# Patient Record
Sex: Female | Born: 1955 | Race: White | Hispanic: No | Marital: Married | Smoking: Never smoker
Health system: Southern US, Community
[De-identification: ages and names within clinical notes are randomized; demographics above are authoritative.]

## PROBLEM LIST (undated history)

## (undated) DIAGNOSIS — M069 Rheumatoid arthritis, unspecified: Secondary | ICD-10-CM

## (undated) DIAGNOSIS — H9193 Unspecified hearing loss, bilateral: Secondary | ICD-10-CM

## (undated) DIAGNOSIS — K5792 Diverticulitis of intestine, part unspecified, without perforation or abscess without bleeding: Secondary | ICD-10-CM

## (undated) DIAGNOSIS — G373 Acute transverse myelitis in demyelinating disease of central nervous system: Secondary | ICD-10-CM

## (undated) DIAGNOSIS — M1A0790 Idiopathic chronic gout, unspecified ankle and foot, without tophus (tophi): Secondary | ICD-10-CM

## (undated) DIAGNOSIS — I5030 Unspecified diastolic (congestive) heart failure: Secondary | ICD-10-CM

## (undated) DIAGNOSIS — M1990 Unspecified osteoarthritis, unspecified site: Secondary | ICD-10-CM

## (undated) DIAGNOSIS — I10 Essential (primary) hypertension: Secondary | ICD-10-CM

## (undated) DIAGNOSIS — I872 Venous insufficiency (chronic) (peripheral): Secondary | ICD-10-CM

## (undated) DIAGNOSIS — K439 Ventral hernia without obstruction or gangrene: Secondary | ICD-10-CM

## (undated) DIAGNOSIS — I890 Lymphedema, not elsewhere classified: Secondary | ICD-10-CM

## (undated) DIAGNOSIS — K8020 Calculus of gallbladder without cholecystitis without obstruction: Secondary | ICD-10-CM

## (undated) DIAGNOSIS — E669 Obesity, unspecified: Secondary | ICD-10-CM

## (undated) DIAGNOSIS — H9190 Unspecified hearing loss, unspecified ear: Secondary | ICD-10-CM

## (undated) DIAGNOSIS — I509 Heart failure, unspecified: Secondary | ICD-10-CM

## (undated) DIAGNOSIS — K449 Diaphragmatic hernia without obstruction or gangrene: Secondary | ICD-10-CM

## (undated) DIAGNOSIS — R200 Anesthesia of skin: Secondary | ICD-10-CM

## (undated) DIAGNOSIS — M71331 Other bursal cyst, right wrist: Secondary | ICD-10-CM

## (undated) DIAGNOSIS — M159 Polyosteoarthritis, unspecified: Secondary | ICD-10-CM

## (undated) DIAGNOSIS — E119 Type 2 diabetes mellitus without complications: Secondary | ICD-10-CM

## (undated) DIAGNOSIS — I272 Pulmonary hypertension, unspecified: Secondary | ICD-10-CM

## (undated) DIAGNOSIS — E785 Hyperlipidemia, unspecified: Secondary | ICD-10-CM

## (undated) DIAGNOSIS — Z79631 Long term (current) use of antimetabolite agent: Secondary | ICD-10-CM

## (undated) DIAGNOSIS — N183 Chronic kidney disease, stage 3 unspecified: Secondary | ICD-10-CM

## (undated) DIAGNOSIS — H3500 Unspecified background retinopathy: Secondary | ICD-10-CM

## (undated) DIAGNOSIS — E11319 Type 2 diabetes mellitus with unspecified diabetic retinopathy without macular edema: Secondary | ICD-10-CM

## (undated) DIAGNOSIS — N189 Chronic kidney disease, unspecified: Secondary | ICD-10-CM

## (undated) DIAGNOSIS — I4510 Unspecified right bundle-branch block: Secondary | ICD-10-CM

## (undated) HISTORY — PX: HYSTERECTOMY, VAGINAL, WITH SALPINGO-OOPHORECTOMY: SHX7589

## (undated) HISTORY — PX: CHOLECYSTECTOMY: SHX55

## (undated) HISTORY — PX: CARDIAC CATHETERIZATION: SHX172

## (undated) HISTORY — PX: ABDOMINAL HYSTERECTOMY: SHX81

---

## 2005-07-29 ENCOUNTER — Emergency Department: Payer: Self-pay | Admitting: Emergency Medicine

## 2006-05-30 ENCOUNTER — Ambulatory Visit: Payer: Self-pay | Admitting: Family Medicine

## 2007-03-07 ENCOUNTER — Emergency Department: Payer: Self-pay | Admitting: Emergency Medicine

## 2007-04-28 ENCOUNTER — Ambulatory Visit: Payer: Self-pay | Admitting: Family Medicine

## 2007-07-28 ENCOUNTER — Ambulatory Visit: Payer: Self-pay | Admitting: Family Medicine

## 2007-11-16 ENCOUNTER — Emergency Department: Payer: Self-pay | Admitting: Emergency Medicine

## 2008-01-29 ENCOUNTER — Ambulatory Visit: Payer: Self-pay | Admitting: Unknown Physician Specialty

## 2008-02-02 ENCOUNTER — Ambulatory Visit: Payer: Self-pay | Admitting: Cardiology

## 2008-02-03 ENCOUNTER — Ambulatory Visit: Payer: Self-pay | Admitting: Unknown Physician Specialty

## 2008-09-17 ENCOUNTER — Ambulatory Visit: Payer: Self-pay | Admitting: Family Medicine

## 2010-07-02 ENCOUNTER — Emergency Department: Payer: Self-pay | Admitting: Emergency Medicine

## 2010-08-01 ENCOUNTER — Ambulatory Visit: Payer: Self-pay | Admitting: Sports Medicine

## 2011-04-18 ENCOUNTER — Ambulatory Visit: Payer: Self-pay | Admitting: Family Medicine

## 2012-08-06 ENCOUNTER — Ambulatory Visit: Payer: Self-pay | Admitting: Family Medicine

## 2013-09-14 ENCOUNTER — Ambulatory Visit: Payer: Self-pay | Admitting: Family Medicine

## 2014-02-23 ENCOUNTER — Emergency Department: Payer: Self-pay | Admitting: Emergency Medicine

## 2014-08-17 ENCOUNTER — Other Ambulatory Visit: Payer: Self-pay | Admitting: Family Medicine

## 2014-08-17 DIAGNOSIS — Z1231 Encounter for screening mammogram for malignant neoplasm of breast: Secondary | ICD-10-CM

## 2014-09-16 ENCOUNTER — Ambulatory Visit: Payer: Self-pay

## 2014-10-05 ENCOUNTER — Ambulatory Visit
Admission: RE | Admit: 2014-10-05 | Discharge: 2014-10-05 | Disposition: A | Discharge status: Home / Self Care | Payer: Medicare Other | Source: Ambulatory Visit | Attending: Family Medicine | Admitting: Family Medicine

## 2014-10-05 DIAGNOSIS — Z1231 Encounter for screening mammogram for malignant neoplasm of breast: Secondary | ICD-10-CM | POA: Diagnosis not present

## 2015-11-10 ENCOUNTER — Other Ambulatory Visit: Payer: Self-pay | Admitting: Family Medicine

## 2015-11-10 DIAGNOSIS — Z1239 Encounter for other screening for malignant neoplasm of breast: Secondary | ICD-10-CM

## 2015-12-15 ENCOUNTER — Ambulatory Visit
Admission: RE | Admit: 2015-12-15 | Discharge: 2015-12-15 | Disposition: A | Discharge status: Home / Self Care | Payer: Medicare Other | Source: Ambulatory Visit | Attending: Family Medicine | Admitting: Family Medicine

## 2015-12-15 DIAGNOSIS — Z1231 Encounter for screening mammogram for malignant neoplasm of breast: Secondary | ICD-10-CM | POA: Insufficient documentation

## 2015-12-15 DIAGNOSIS — Z1239 Encounter for other screening for malignant neoplasm of breast: Secondary | ICD-10-CM

## 2016-06-11 DIAGNOSIS — Z794 Long term (current) use of insulin: Secondary | ICD-10-CM | POA: Diagnosis not present

## 2016-06-11 DIAGNOSIS — E119 Type 2 diabetes mellitus without complications: Secondary | ICD-10-CM | POA: Diagnosis not present

## 2016-06-18 DIAGNOSIS — E1165 Type 2 diabetes mellitus with hyperglycemia: Secondary | ICD-10-CM | POA: Diagnosis not present

## 2016-06-18 DIAGNOSIS — H3500 Unspecified background retinopathy: Secondary | ICD-10-CM | POA: Diagnosis not present

## 2016-06-24 ENCOUNTER — Emergency Department
Admission: EM | Admit: 2016-06-24 | Discharge: 2016-06-24 | Disposition: A | Discharge status: Home / Self Care | Payer: Medicare Other | Attending: Emergency Medicine | Admitting: Emergency Medicine

## 2016-06-24 ENCOUNTER — Emergency Department: Payer: Medicare Other

## 2016-06-24 DIAGNOSIS — E0841 Diabetes mellitus due to underlying condition with diabetic mononeuropathy: Secondary | ICD-10-CM

## 2016-06-24 DIAGNOSIS — M79672 Pain in left foot: Secondary | ICD-10-CM | POA: Insufficient documentation

## 2016-06-24 DIAGNOSIS — E1140 Type 2 diabetes mellitus with diabetic neuropathy, unspecified: Secondary | ICD-10-CM | POA: Diagnosis not present

## 2016-06-24 DIAGNOSIS — M79662 Pain in left lower leg: Secondary | ICD-10-CM | POA: Diagnosis present

## 2016-06-24 LAB — CBC WITH DIFFERENTIAL/PLATELET
Basophils Absolute: 0.1 10*3/uL (ref 0–0.1)
Basophils Relative: 1 %
Eosinophils Absolute: 0.5 10*3/uL (ref 0–0.7)
Eosinophils Relative: 5 %
HCT: 37.9 % (ref 35.0–47.0)
Hemoglobin: 12.8 g/dL (ref 12.0–16.0)
Lymphocytes Relative: 33 %
Lymphs Abs: 3.4 10*3/uL (ref 1.0–3.6)
MCH: 29.4 pg (ref 26.0–34.0)
MCHC: 33.7 g/dL (ref 32.0–36.0)
MCV: 87.3 fL (ref 80.0–100.0)
Monocytes Absolute: 0.6 10*3/uL (ref 0.2–0.9)
Monocytes Relative: 6 %
Neutro Abs: 5.8 10*3/uL (ref 1.4–6.5)
Neutrophils Relative %: 55 %
Platelets: 270 10*3/uL (ref 150–440)
RBC: 4.35 MIL/uL (ref 3.80–5.20)
RDW: 14.6 % — ABNORMAL HIGH (ref 11.5–14.5)
WBC: 10.4 10*3/uL (ref 3.6–11.0)

## 2016-06-24 LAB — URIC ACID: Uric Acid, Serum: 6.3 mg/dL (ref 2.3–6.6)

## 2016-06-24 LAB — COMPREHENSIVE METABOLIC PANEL
ALT: 28 U/L (ref 14–54)
AST: 30 U/L (ref 15–41)
Albumin: 3.9 g/dL (ref 3.5–5.0)
Alkaline Phosphatase: 58 U/L (ref 38–126)
Anion gap: 10 (ref 5–15)
BUN: 14 mg/dL (ref 6–20)
CO2: 26 mmol/L (ref 22–32)
Calcium: 9.4 mg/dL (ref 8.9–10.3)
Chloride: 100 mmol/L — ABNORMAL LOW (ref 101–111)
Creatinine, Ser: 1.03 mg/dL — ABNORMAL HIGH (ref 0.44–1.00)
GFR calc Af Amer: >60 mL/min (ref >60)
GFR calc non Af Amer: 57 mL/min — ABNORMAL LOW (ref >60)
Glucose, Bld: 321 mg/dL — ABNORMAL HIGH (ref 65–99)
Potassium: 4.2 mmol/L (ref 3.5–5.1)
Sodium: 136 mmol/L (ref 135–145)
Total Bilirubin: 0.7 mg/dL (ref 0.3–1.2)
Total Protein: 7.9 g/dL (ref 6.5–8.1)

## 2016-06-24 MED ORDER — METHYLPREDNISOLONE SODIUM SUCC 125 MG IJ SOLR
125.0000 mg | Freq: Once | INTRAMUSCULAR | Status: AC
Start: 2016-06-24 — End: 2016-06-24
  Administered 2016-06-24: 125 mg via INTRAVENOUS
  Filled 2016-06-24: qty 2

## 2016-06-24 MED ORDER — PREDNISONE 10 MG (21) PO TBPK: ORAL_TABLET | Freq: Every day | ORAL | 0 refills | Status: AC

## 2016-06-24 NOTE — ED Notes (Signed)
Reviewed d/c instructions, follow-up care, prescription with patient. Pt verbalized understanding.  

## 2016-06-24 NOTE — ED Provider Notes (Signed)
[facility] Emergency Department Provider Note  ____________________________________________  Time seen: Approximately 8:09 PM  I have reviewed the triage vital signs and the nursing notes.   HISTORY  Chief Complaint Leg Pain    HPI Alice Hughes is a 61 y.o. female with a history of diabetes presents to the emergency department with left foot and left leg pain for the past 2 days. Patient rates pain at 10 out of 10 in intensity and describes it as "like electricity". Patient denies falls or mechanisms of trauma. Patient denies back pain. Patient also states that she has felt fatigued today and "not like herself". Patient denies history of DVTs, prolonged immobility, recent surgery, recent travel, excessive red meat consumption, daily alcohol use, fever or history of septic joints. No alleviating measures have been attempted.   No past medical history on file.  There are no active problems to display for this patient.   No past surgical history on file.  Prior to Admission medications   Medication Sig Start Date End Date Taking? Authorizing Provider  predniSONE (STERAPRED UNI-PAK 21 TAB) 10 MG (21) TBPK tablet Take by mouth daily. Take 6 tablets the first day, take 5 tablets the second day, take 4 tablets the third day, take 3 tablets the fourth day, take 2 tablets the fifth day, take 1 tablet the sixth day. 06/24/16 06/30/16  Lannie Fields, PA-C    Allergies Patient has no known allergies.  Family History  Problem Relation Age of Onset  . Breast cancer Sister 79    Social History Social History  Substance Use Topics  . Smoking status: Not on file  . Smokeless tobacco: Not on file  . Alcohol use Not on file     Review of Systems  Constitutional: No fever/chills Eyes: No visual changes. No discharge ENT: No upper respiratory complaints. Cardiovascular: no chest pain. Respiratory: no cough. No SOB. Musculoskeletal: Patient has left foot and  left calf pain. Skin: Negative for rash, abrasions, lacerations, ecchymosis. Neurological: Negative for headaches, focal weakness or numbness. .  ____________________________________________   PHYSICAL EXAM:  VITAL SIGNS: ED Triage Vitals  Enc Vitals Group     BP 06/24/16 1923 (!) 154/76     Pulse Rate 06/24/16 1923 (!) 105     Resp 06/24/16 1923 18     Temp 06/24/16 1923 98.3 F (36.8 C)     Temp Source 06/24/16 1923 Oral     SpO2 06/24/16 1923 98 %     Weight 06/24/16 1924 265 lb (120.2 kg)     Height 06/24/16 1924 5\' 4"  (1.626 m)     Head Circumference --      Peak Flow --      Pain Score 06/24/16 1921 8     Pain Loc --      Pain Edu? --      Excl. in [place]? --      Constitutional: Alert and oriented. Well appearing and in no acute distress. Eyes: Conjunctivae are normal. PERRL. EOMI. Head: Atraumatic.  Cardiovascular: Normal rate, regular rhythm. Normal S1 and S2.  Good peripheral circulation. Respiratory: Normal respiratory effort without tachypnea or retractions. Lungs CTAB. Good air entry to the bases with no decreased or absent breath sounds. Gastrointestinal: Bowel sounds 4 quadrants. Soft and nontender to palpation. No guarding or rigidity. No palpable masses. No distention. No CVA tenderness. Musculoskeletal:Patient demonstrates full range of motion at the left knee and left ankle. Patient is able to move all 5 left  toes palpable dorsalis pedis pulse bilaterally and symmetrically. Patient has diffuse pain to palpation along the left foot.  Neurologic:  Normal speech and language. Patient is exceptionally tender to light touch along the L5, S1 and S2 dermatomes. Skin:  No diabetic foot ulcers visualized along the feet bilaterally. Psychiatric: Mood and affect are normal. Speech and behavior are normal. Patient exhibits appropriate insight and judgement.   ____________________________________________   LABS (all labs ordered are listed, but only abnormal results  are displayed)  Labs Reviewed  CBC WITH DIFFERENTIAL/PLATELET - Abnormal; Notable for the following:       Result Value   RDW 14.6 (*)    All other components within normal limits  COMPREHENSIVE METABOLIC PANEL - Abnormal; Notable for the following:    Chloride 100 (*)    Glucose, Bld 321 (*)    Creatinine, Ser 1.03 (*)    GFR calc non Af Amer 57 (*)    All other components within normal limits  URIC ACID   ____________________________________________  EKG   ____________________________________________  RADIOLOGY Unk Pinto, personally viewed and evaluated these images (plain radiographs) as part of my medical decision making, as well as reviewing the written report by the radiologist.    Dg Tibia/fibula Left  Result Date: 06/24/2016 CLINICAL DATA:  Nontraumatic left foot and lower leg pain since yesterday. EXAM: LEFT TIBIA AND FIBULA - 2 VIEW COMPARISON:  None. FINDINGS: There is no evidence of fracture or other focal bone lesions. Soft tissues are unremarkable. There is chondrocalcinosis at the knee joint. IMPRESSION: Left knee joint chondrocalcinosis. Bones and soft tissues are unremarkable. Electronically Signed   By: Andreas Newport M.D.   On: 06/24/2016 21:04   Dg Foot Complete Left  Result Date: 06/24/2016 CLINICAL DATA:  Nontraumatic left foot pain for 1 day. EXAM: LEFT FOOT - COMPLETE 3+ VIEW COMPARISON:  None. FINDINGS: There is no evidence of fracture or dislocation. There is no evidence of arthropathy or other focal bone abnormality. Soft tissues are unremarkable. IMPRESSION: Negative. Electronically Signed   By: Andreas Newport M.D.   On: 06/24/2016 21:05    ____________________________________________    PROCEDURES  Procedure(s) performed:    Procedures    Medications  methylPREDNISolone sodium succinate (SOLU-MEDROL) 125 mg/2 mL injection 125 mg (125 mg Intravenous Given 06/24/16 2119)      ____________________________________________   INITIAL IMPRESSION / ASSESSMENT AND PLAN / ED COURSE  Pertinent labs & imaging results that were available during my care of the patient were reviewed by me and considered in my medical decision making (see chart for details).  Review of the [place] CSRS was performed in accordance of the [place] prior to dispensing any controlled drugs.    Assessment and Plan: Diabetic Neuropathy:  Patient's diagnosis is consistent with Diabetic neuropathy. CBC, CMP and uric acid levels were reassuring. DG left foot and DG tibia/fibula revealed no acute bony abnormality. Patient will be discharged home with prescriptions for tapered prednisone. Patient is to follow up with primary care as needed or otherwise directed. Patient is given ED precautions to return to the ED for any worsening or new symptoms.  ____________________________________________  FINAL CLINICAL IMPRESSION(S) / ED DIAGNOSES  Final diagnoses:  Diabetic mononeuropathy associated with diabetes mellitus due to underlying condition [address] [facility])      NEW MEDICATIONS STARTED DURING THIS VISIT:  Discharge Medication List as of 06/24/2016  9:26 PM    START taking these medications   Details  predniSONE (STERAPRED UNI-PAK 21 TAB) 10  MG (21) TBPK tablet Take by mouth daily. Take 6 tablets the first day, take 5 tablets the second day, take 4 tablets the third day, take 3 tablets the fourth day, take 2 tablets the fifth day, take 1 tablet the sixth day., Starting Sun 06/24/2016, Until Sat 06/30/2016, Print            This chart was dictated using voice recognition software/Dragon. Despite best efforts to proofread, errors can occur which can change the meaning. Any change was purely unintentional.    Karren Cobble 06/24/16 2159    Carrie Mew, MD 06/25/16 [phone]

## 2016-06-24 NOTE — ED Triage Notes (Signed)
Pt states that she started having left foot and left leg pain uesterday, shocking pains, states that she is diabetic but has never been diagnosed with neuropathy, pt also denies hx of blood clots. Pt is communicating through the sign language interpreter. Pedal pulse palpated and marked at the left foot, swelling noted to bilat feet and legs

## 2016-06-24 NOTE — ED Notes (Signed)
Used interpreter on a stick to assess, monitor, communicate with, and discharge patient.

## 2016-07-11 DIAGNOSIS — R350 Frequency of micturition: Secondary | ICD-10-CM | POA: Diagnosis not present

## 2016-09-18 DIAGNOSIS — Z794 Long term (current) use of insulin: Secondary | ICD-10-CM | POA: Diagnosis not present

## 2016-09-18 DIAGNOSIS — E1165 Type 2 diabetes mellitus with hyperglycemia: Secondary | ICD-10-CM | POA: Diagnosis not present

## 2016-09-25 DIAGNOSIS — E1165 Type 2 diabetes mellitus with hyperglycemia: Secondary | ICD-10-CM | POA: Diagnosis not present

## 2016-09-25 DIAGNOSIS — H3500 Unspecified background retinopathy: Secondary | ICD-10-CM | POA: Diagnosis not present

## 2016-11-05 ENCOUNTER — Other Ambulatory Visit: Payer: Self-pay | Admitting: Family Medicine

## 2016-11-05 DIAGNOSIS — Z1231 Encounter for screening mammogram for malignant neoplasm of breast: Secondary | ICD-10-CM

## 2016-11-08 DIAGNOSIS — Z0000 Encounter for general adult medical examination without abnormal findings: Secondary | ICD-10-CM | POA: Diagnosis not present

## 2016-11-08 DIAGNOSIS — I10 Essential (primary) hypertension: Secondary | ICD-10-CM | POA: Diagnosis not present

## 2016-11-08 DIAGNOSIS — Z124 Encounter for screening for malignant neoplasm of cervix: Secondary | ICD-10-CM | POA: Diagnosis not present

## 2016-11-20 DIAGNOSIS — E1165 Type 2 diabetes mellitus with hyperglycemia: Secondary | ICD-10-CM | POA: Diagnosis not present

## 2016-11-20 DIAGNOSIS — Z794 Long term (current) use of insulin: Secondary | ICD-10-CM | POA: Diagnosis not present

## 2017-01-08 ENCOUNTER — Ambulatory Visit
Admission: RE | Admit: 2017-01-08 | Discharge: 2017-01-08 | Disposition: A | Discharge status: Home / Self Care | Payer: Medicare Other | Source: Ambulatory Visit | Attending: Family Medicine | Admitting: Family Medicine

## 2017-01-08 DIAGNOSIS — Z1231 Encounter for screening mammogram for malignant neoplasm of breast: Secondary | ICD-10-CM | POA: Insufficient documentation

## 2017-01-25 DIAGNOSIS — E1165 Type 2 diabetes mellitus with hyperglycemia: Secondary | ICD-10-CM | POA: Diagnosis not present

## 2017-01-25 DIAGNOSIS — Z794 Long term (current) use of insulin: Secondary | ICD-10-CM | POA: Diagnosis not present

## 2017-02-11 DIAGNOSIS — E1165 Type 2 diabetes mellitus with hyperglycemia: Secondary | ICD-10-CM | POA: Diagnosis not present

## 2017-02-11 DIAGNOSIS — Z794 Long term (current) use of insulin: Secondary | ICD-10-CM | POA: Diagnosis not present

## 2017-02-11 DIAGNOSIS — E119 Type 2 diabetes mellitus without complications: Secondary | ICD-10-CM | POA: Diagnosis not present

## 2017-02-11 DIAGNOSIS — H3500 Unspecified background retinopathy: Secondary | ICD-10-CM | POA: Diagnosis not present

## 2017-05-07 DIAGNOSIS — E1165 Type 2 diabetes mellitus with hyperglycemia: Secondary | ICD-10-CM | POA: Diagnosis not present

## 2017-05-13 DIAGNOSIS — H3500 Unspecified background retinopathy: Secondary | ICD-10-CM | POA: Diagnosis not present

## 2017-05-13 DIAGNOSIS — E1165 Type 2 diabetes mellitus with hyperglycemia: Secondary | ICD-10-CM | POA: Diagnosis not present

## 2017-08-18 ENCOUNTER — Other Ambulatory Visit: Payer: Self-pay

## 2017-08-18 ENCOUNTER — Encounter: Payer: Self-pay | Admitting: Emergency Medicine

## 2017-08-18 ENCOUNTER — Emergency Department
Admission: EM | Admit: 2017-08-18 | Discharge: 2017-08-18 | Disposition: A | Discharge status: Home / Self Care | Payer: Medicare Other | Attending: Emergency Medicine | Admitting: Emergency Medicine

## 2017-08-18 DIAGNOSIS — M542 Cervicalgia: Secondary | ICD-10-CM | POA: Diagnosis present

## 2017-08-18 DIAGNOSIS — R110 Nausea: Secondary | ICD-10-CM | POA: Diagnosis not present

## 2017-08-18 DIAGNOSIS — Z79899 Other long term (current) drug therapy: Secondary | ICD-10-CM | POA: Insufficient documentation

## 2017-08-18 DIAGNOSIS — R51 Headache: Secondary | ICD-10-CM | POA: Diagnosis not present

## 2017-08-18 DIAGNOSIS — M436 Torticollis: Secondary | ICD-10-CM | POA: Diagnosis not present

## 2017-08-18 DIAGNOSIS — Z794 Long term (current) use of insulin: Secondary | ICD-10-CM | POA: Diagnosis not present

## 2017-08-18 DIAGNOSIS — E119 Type 2 diabetes mellitus without complications: Secondary | ICD-10-CM | POA: Diagnosis not present

## 2017-08-18 HISTORY — DX: Type 2 diabetes mellitus without complications: E11.9

## 2017-08-18 MED ORDER — CYCLOBENZAPRINE HCL 5 MG PO TABS: ORAL_TABLET | ORAL | 0 refills | Status: DC

## 2017-08-18 MED ORDER — KETOROLAC TROMETHAMINE 30 MG/ML IJ SOLN
30.0000 mg | Freq: Once | INTRAMUSCULAR | Status: AC
  Administered 2017-08-18: 30 mg via INTRAMUSCULAR
  Filled 2017-08-18: qty 1

## 2017-08-18 MED ORDER — HYDROCODONE-ACETAMINOPHEN 5-325 MG PO TABS
1.0000 | ORAL_TABLET | Freq: Once | ORAL | Status: AC
  Administered 2017-08-18: 1 via ORAL
  Filled 2017-08-18: qty 1

## 2017-08-18 MED ORDER — IBUPROFEN 800 MG PO TABS: 800.0000 mg | ORAL_TABLET | Freq: Three times a day (TID) | ORAL | 0 refills | Status: DC | PRN

## 2017-08-18 MED ORDER — HYDROCODONE-ACETAMINOPHEN 5-325 MG PO TABS: 1.0000 | ORAL_TABLET | Freq: Four times a day (QID) | ORAL | 0 refills | Status: DC | PRN

## 2017-08-18 MED ORDER — CYCLOBENZAPRINE HCL 10 MG PO TABS
5.0000 mg | ORAL_TABLET | Freq: Once | ORAL | Status: AC
  Administered 2017-08-18: 5 mg via ORAL
  Filled 2017-08-18: qty 1

## 2017-08-18 NOTE — ED Notes (Signed)
Pt. Going home with friend 

## 2017-08-18 NOTE — ED Provider Notes (Signed)
[facility] Emergency Department Provider Note   ____________________________________________   First MD Initiated Contact with Patient 08/18/17 [phone]     (approximate)  I have reviewed the triage vital signs and the nursing notes.   HISTORY  Chief Complaint Neck Pain  History obtained via [employer] deaf interpreter  HPI Alice Hughes is a 62 y.o. female who presents to the ED from home with a chief complaint of nontraumatic neck pain.  Patient reports she awoke 3 days ago with bilateral neck pain which is worsened on movement.  Subsequently she developed a gradual onset global headache associated with nausea.  Denies associated fever, chills, vision changes, chest pain, shortness of breath, abdominal pain, vomiting.  Denies recent travel or trauma.   Past Medical History:  Diagnosis Date  . Diabetes mellitus without complication ([place])     There are no active problems to display for this patient.   No past surgical history on file.  Prior to Admission medications   Medication Sig Start Date End Date Taking? Authorizing Provider  glimepiride (AMARYL) 4 MG tablet Take 4 mg by mouth daily with breakfast.   Yes [provider]  insulin glargine (LANTUS) 100 UNIT/ML injection Inject into the skin daily.   Yes [provider]  insulin regular (NOVOLIN R,HUMULIN R) 100 units/mL injection Inject into the skin 3 (three) times daily before meals.   Yes [provider]  lisinopril (PRINIVIL,ZESTRIL) 10 MG tablet Take 10 mg by mouth daily.   Yes [provider]  metFORMIN (GLUCOPHAGE) 1000 MG tablet Take 1,000 mg by mouth 2 (two) times daily with a meal.   Yes [provider]    Allergies Patient has no known allergies.  Family History  Problem Relation Age of Onset  . Breast cancer Sister 49    Social History Social History   Tobacco Use  . Smoking status: Not on file  Substance Use Topics  . Alcohol use:  Not on file  . Drug use: Not on file    Review of Systems  Constitutional: No fever/chills Eyes: No visual changes. ENT: No sore throat. Cardiovascular: Denies chest pain. Respiratory: Denies shortness of breath. Gastrointestinal: No abdominal pain.  No nausea, no vomiting.  No diarrhea.  No constipation. Genitourinary: Negative for dysuria. Musculoskeletal: Positive for neck pain.  Negative for back pain. Skin: Negative for rash. Neurological: Negative for headaches, focal weakness or numbness.   ____________________________________________   PHYSICAL EXAM:  VITAL SIGNS: ED Triage Vitals  Enc Vitals Group     BP 08/18/17 0250 (!) 148/61     Pulse Rate 08/18/17 0250 91     Resp 08/18/17 0250 (!) 22     Temp 08/18/17 0250 97.8 F (36.6 C)     Temp Source 08/18/17 0250 Oral     SpO2 08/18/17 0250 98 %     Weight 08/18/17 0254 264 lb (119.7 kg)     Height 08/18/17 0254 5\' 4"  (1.626 m)     Head Circumference --      Peak Flow --      Pain Score --      Pain Loc --      Pain Edu? --      Excl. in [place]? --     Constitutional: Alert and oriented. Well appearing and in mild acute distress. Eyes: Conjunctivae are normal. PERRL. EOMI. Head: Atraumatic. Nose: No congestion/rhinnorhea. Mouth/Throat: Mucous membranes are moist.  Oropharynx non-erythematous. Neck: No stridor.  No cervical spine tenderness  to palpation.  No carotid bruits.  Bilateral trapezius and SCM muscle spasms.  Limited range of motion secondary to pain. Cardiovascular: Normal rate, regular rhythm. Grossly normal heart sounds.  Good peripheral circulation. Respiratory: Normal respiratory effort.  No retractions. Lungs CTAB. Gastrointestinal: Soft and nontender. No distention. No abdominal bruits. No CVA tenderness. Musculoskeletal: No lower extremity tenderness nor edema.  No joint effusions. Neurologic:  Normal speech and language. No gross focal neurologic deficits are appreciated. 5/5 motor strength and  sensation. MAEx4. No gait instability. Skin:  Skin is warm, dry and intact. No rash noted. Psychiatric: Mood and affect are normal. Speech and behavior are normal.  ____________________________________________   LABS (all labs ordered are listed, but only abnormal results are displayed)  Labs Reviewed - No data to display ____________________________________________  EKG  None ____________________________________________  RADIOLOGY  ED MD interpretation:  None  Official radiology report(s): No results found.  ____________________________________________   PROCEDURES  Procedure(s) performed: None  Procedures  Critical Care performed: No  ____________________________________________   INITIAL IMPRESSION / ASSESSMENT AND PLAN / ED COURSE  As part of my medical decision making, I reviewed the following data within the [place] History obtained from family, Nursing notes reviewed and incorporated, Old chart reviewed and Notes from prior ED visits   62 year old female who presents with nontraumatic neck pain. Bilateral trapezius and SCM muscle spasms on exam. Will administer IM NSAIDs, Norco, Flexeril and patient will follow up with orthopedics closely. Strict return precautions given. Patient and spouse verbalize understanding and agree with plan of care.      ____________________________________________   FINAL CLINICAL IMPRESSION(S) / ED DIAGNOSES  Final diagnoses:  Torticollis     ED Discharge Orders    None       Note:  This document was prepared using Dragon voice recognition software and may include unintentional dictation errors.    Paulette Blanch, MD 08/18/17 [phone]

## 2017-08-18 NOTE — Discharge Instructions (Addendum)
You may take medicines as needed for pain and muscle spasms (Motrin/Norco/Flexeril #15). Apply moist heat to affected area several times daily. Return to the ER for worsening symptoms, persistent vomiting, difficulty breathing or other concerns.

## 2017-08-18 NOTE — ED Triage Notes (Signed)
Info obtained using [employer] Alvy Bimler [phone]) Patient reports neck pain for past 3 days.  Reports extreme pain with movement.

## 2017-08-20 DIAGNOSIS — E1165 Type 2 diabetes mellitus with hyperglycemia: Secondary | ICD-10-CM | POA: Diagnosis not present

## 2017-08-27 DIAGNOSIS — E1165 Type 2 diabetes mellitus with hyperglycemia: Secondary | ICD-10-CM | POA: Diagnosis not present

## 2017-08-27 DIAGNOSIS — E1169 Type 2 diabetes mellitus with other specified complication: Secondary | ICD-10-CM | POA: Diagnosis not present

## 2017-09-27 ENCOUNTER — Other Ambulatory Visit: Payer: Self-pay

## 2017-09-27 ENCOUNTER — Encounter: Payer: Self-pay | Admitting: Emergency Medicine

## 2017-09-27 ENCOUNTER — Emergency Department: Payer: Medicare Other

## 2017-09-27 ENCOUNTER — Emergency Department
Admission: EM | Admit: 2017-09-27 | Discharge: 2017-09-28 | Disposition: A | Discharge status: Home / Self Care | Payer: Medicare Other | Attending: Emergency Medicine | Admitting: Emergency Medicine

## 2017-09-27 DIAGNOSIS — I10 Essential (primary) hypertension: Secondary | ICD-10-CM | POA: Insufficient documentation

## 2017-09-27 DIAGNOSIS — Z794 Long term (current) use of insulin: Secondary | ICD-10-CM | POA: Diagnosis not present

## 2017-09-27 DIAGNOSIS — E1142 Type 2 diabetes mellitus with diabetic polyneuropathy: Secondary | ICD-10-CM | POA: Insufficient documentation

## 2017-09-27 DIAGNOSIS — M79601 Pain in right arm: Secondary | ICD-10-CM | POA: Diagnosis not present

## 2017-09-27 DIAGNOSIS — R29898 Other symptoms and signs involving the musculoskeletal system: Secondary | ICD-10-CM | POA: Insufficient documentation

## 2017-09-27 DIAGNOSIS — S299XXA Unspecified injury of thorax, initial encounter: Secondary | ICD-10-CM | POA: Diagnosis not present

## 2017-09-27 DIAGNOSIS — M79602 Pain in left arm: Secondary | ICD-10-CM | POA: Insufficient documentation

## 2017-09-27 DIAGNOSIS — Z79899 Other long term (current) drug therapy: Secondary | ICD-10-CM | POA: Diagnosis not present

## 2017-09-27 DIAGNOSIS — S3992XA Unspecified injury of lower back, initial encounter: Secondary | ICD-10-CM | POA: Diagnosis not present

## 2017-09-27 DIAGNOSIS — S199XXA Unspecified injury of neck, initial encounter: Secondary | ICD-10-CM | POA: Diagnosis not present

## 2017-09-27 DIAGNOSIS — M6281 Muscle weakness (generalized): Secondary | ICD-10-CM | POA: Diagnosis not present

## 2017-09-27 HISTORY — DX: Essential (primary) hypertension: I10

## 2017-09-27 LAB — BASIC METABOLIC PANEL
Anion gap: 12 (ref 5–15)
BUN: 15 mg/dL (ref 8–23)
CALCIUM: 9 mg/dL (ref 8.9–10.3)
CO2: 24 mmol/L (ref 22–32)
CREATININE: 0.94 mg/dL (ref 0.44–1.00)
Chloride: 102 mmol/L (ref 98–111)
GFR calc Af Amer: >60 mL/min (ref >60)
Glucose, Bld: 284 mg/dL — ABNORMAL HIGH (ref 70–99)
Potassium: 4.6 mmol/L (ref 3.5–5.1)
Sodium: 138 mmol/L (ref 135–145)

## 2017-09-27 LAB — CBC
HCT: 36.7 % (ref 35.0–47.0)
Hemoglobin: 11.8 g/dL — ABNORMAL LOW (ref 12.0–16.0)
MCH: 28.5 pg (ref 26.0–34.0)
MCHC: 32.1 g/dL (ref 32.0–36.0)
MCV: 88.9 fL (ref 80.0–100.0)
PLATELETS: 305 10*3/uL (ref 150–440)
RBC: 4.12 MIL/uL (ref 3.80–5.20)
RDW: 15.5 % — AB (ref 11.5–14.5)
WBC: 10.1 10*3/uL (ref 3.6–11.0)

## 2017-09-27 MED ORDER — KETOROLAC TROMETHAMINE 30 MG/ML IJ SOLN
30.0000 mg | Freq: Once | INTRAMUSCULAR | Status: AC
  Administered 2017-09-27: 30 mg via INTRAVENOUS
  Filled 2017-09-27: qty 1

## 2017-09-27 MED ORDER — NAPROXEN 500 MG PO TABS: 500.0000 mg | ORAL_TABLET | Freq: Two times a day (BID) | ORAL | 0 refills | Status: AC

## 2017-09-27 NOTE — ED Notes (Signed)
Assessment assistance provided by ASL video interpreter.  Pt states around 0100 this morning she had a fall due to right foot being numb when she was getting up to the bathroom.  Pt states when her husband touched her foot on the bottom it was painful. Pt states she is a diabetic but has no hx of neuropathy.  Pt states she has had right shoulder pain since Sept 1 and the left shoulder started hurting about 3 days ago.  The pain is so bad that it is affecting her ability to sign, brush her hair, wash her hair.  No facial droop noted. Pt unable to feel in right foot when touched.  Pt states the pain in her shoulders is a shooting pain and rates the pain 7/10.

## 2017-09-27 NOTE — ED Notes (Signed)
Pt given cup of water ok'd by Dr. Cherylann Banas.

## 2017-09-27 NOTE — ED Notes (Signed)
Pt and husband advised of MRI going to 8pm.  Pt reports she can move her right leg and foot better and raise shoulders up.

## 2017-09-27 NOTE — Discharge Instructions (Addendum)
Make an appointment to follow-up with your doctor.  Take the anti-inflammatory medication as needed.  Return to the ER for new, worsening, persistent weakness, numbness, difficulty walking or bearing weight, or any other new or worsening symptoms that concern you.

## 2017-09-27 NOTE — ED Notes (Signed)
EDP at bedside with ASL interpreter

## 2017-09-27 NOTE — ED Triage Notes (Signed)
Pt presents to ED via wheelchair from [facility] Pt states she was seen in August for neck pain and given an injection and was sent home and she "felt bad", pt states sept 1 she had pain to R arm for approx 1 week, then pain went to L arm. Pt states she continues to have HA, bilateral shoulder pain. Pt states this morning she attempted to get up to go to the bathroom and her R leg gave out and she fell. Pt denies LOC, denies new injury from fall. Pt states unable to stand after the fall. Pt states increasing feeling of immobility and numbness x 1 month.

## 2017-09-27 NOTE — ED Provider Notes (Signed)
[facility] Emergency Department Provider Note ____________________________________________   First MD Initiated Contact with Patient 09/27/17 1603     (approximate)  I have reviewed the triage vital signs and the nursing notes.   HISTORY  Chief Complaint Fall    HPI Alice Hughes is a 62 y.o. female with PMH of diabetes and hypertension who presents with weakness and pain to bilateral upper and lower extremities, acutely worsened today and mainly in the right leg.  The patient states that for at least a month she has had some numbness and tingling to bilateral hands, as well as intermittent pain to bilateral shoulders.  She reports that bilateral arms feel heavy.  She states she has had some increased weakness in her legs and difficulty walking and getting up.  She reports that this morning, it acutely worsened when she tried to get out of bed, causing her to fall.  She reports some tingling in bilateral feet but the weakness is worse on the right.  She denies any back pain.  Past Medical History:  Diagnosis Date  . Diabetes mellitus without complication ([place])   . Hypertension     There are no active problems to display for this patient.   Past Surgical History:  Procedure Laterality Date  . CHOLECYSTECTOMY      Prior to Admission medications   Medication Sig Start Date End Date Taking? Authorizing Provider  cyclobenzaprine (FLEXERIL) 5 MG tablet 1 tablet every 8 hours as he did for muscle spasms 08/18/17   Paulette Blanch, MD  glimepiride (AMARYL) 4 MG tablet Take 4 mg by mouth daily with breakfast.    [provider]  HYDROcodone-acetaminophen (NORCO) 5-325 MG tablet Take 1 tablet by mouth every 6 (six) hours as needed for moderate pain. 08/18/17   Paulette Blanch, MD  ibuprofen (ADVIL,MOTRIN) 800 MG tablet Take 1 tablet (800 mg total) by mouth every 8 (eight) hours as needed for moderate pain. 08/18/17   Paulette Blanch, MD  insulin glargine (LANTUS)  100 UNIT/ML injection Inject into the skin daily.    [provider]  insulin regular (NOVOLIN R,HUMULIN R) 100 units/mL injection Inject into the skin 3 (three) times daily before meals.    [provider]  lisinopril (PRINIVIL,ZESTRIL) 10 MG tablet Take 10 mg by mouth daily.    [provider]  metFORMIN (GLUCOPHAGE) 1000 MG tablet Take 1,000 mg by mouth 2 (two) times daily with a meal.    [provider]  naproxen (NAPROSYN) 500 MG tablet Take 1 tablet (500 mg total) by mouth 2 (two) times daily with a meal for 15 days. 09/27/17 10/12/17  Arta Silence, MD    Allergies Patient has no known allergies.  Family History  Problem Relation Age of Onset  . Breast cancer Sister 22    Social History Social History   Tobacco Use  . Smoking status: Never Smoker  . Smokeless tobacco: Never Used  Substance Use Topics  . Alcohol use: Yes  . Drug use: Never    Review of Systems  Constitutional: No fever. Eyes: No redness. ENT: No sore throat. Cardiovascular: Denies chest pain. Respiratory: Denies shortness of breath. Gastrointestinal: No vomiting.  No diarrhea.  Genitourinary: Negative for dysuria.  Musculoskeletal: Negative for back pain.  Positive for bilateral shoulder pain. Skin: Negative for rash. Neurological: Negative for headache.  Positive for right  leg weakness.   ____________________________________________   PHYSICAL EXAM:  VITAL SIGNS: ED Triage Vitals  Enc  Vitals Group     BP 09/27/17 1430 139/62     Pulse Rate 09/27/17 1430 83     Resp 09/27/17 1430 18     Temp 09/27/17 1430 98.1 F (36.7 C)     Temp Source 09/27/17 1430 Oral     SpO2 09/27/17 1430 96 %     Weight 09/27/17 1435 282 lb (127.9 kg)     Height 09/27/17 1435 5\' 5"  (1.651 m)     Head Circumference --      Peak Flow --      Pain Score 09/27/17 1434 7     Pain Loc --      Pain Edu? --      Excl. in [place]? --     Constitutional: Alert and oriented.   Relatively comfortable appearing and in no acute distress. Eyes: Conjunctivae are normal.  EOMI. Head: Atraumatic. Nose: No congestion/rhinnorhea. Mouth/Throat: Mucous membranes are moist.   Neck: Normal range of motion.  No midline cervical spinal tenderness. Cardiovascular: Good peripheral circulation. Respiratory: Normal respiratory effort.  Gastrointestinal: No distention.  Genitourinary: No flank tenderness. Musculoskeletal: No lower extremity edema.  Extremities warm and well perfused.  Neurologic:  Normal speech and language.  Difficulty abducting bilateral arms due to pain.  4/5 grip strength to bilateral hands.  3/5 motor strength to right leg.  5/5 motor strength left leg.  Subjective decreased sensation of bilateral distal legs. Skin:  Skin is warm and dry. No rash noted. Psychiatric: Mood and affect are normal. Speech and behavior are normal.  ____________________________________________   LABS (all labs ordered are listed, but only abnormal results are displayed)  Labs Reviewed  BASIC METABOLIC PANEL - Abnormal; Notable for the following components:      Result Value   Glucose, Bld 284 (*)    All other components within normal limits  CBC - Abnormal; Notable for the following components:   Hemoglobin 11.8 (*)    RDW 15.5 (*)    All other components within normal limits  URINALYSIS, COMPLETE (UACMP) WITH MICROSCOPIC  CBG MONITORING, ED   ____________________________________________  EKG  ED ECG REPORT I, Arta Silence, the attending physician, personally viewed and interpreted this ECG.  Date: 09/27/2017 EKG Time: 1444 Rate: 90 Rhythm: normal sinus rhythm QRS Axis: normal Intervals: RBBB ST/T Wave abnormalities: normal Narrative Interpretation: no evidence of acute ischemia  ____________________________________________  RADIOLOGY  MR cervical spine: No acute spinal cord compression MR thoracic spine: No acute spinal cord compression MR lumbar  spine: No acute spinal cord compression  ____________________________________________   PROCEDURES  Procedure(s) performed: No  Procedures  Critical Care performed: No ____________________________________________   INITIAL IMPRESSION / ASSESSMENT AND PLAN / ED COURSE  Pertinent labs & imaging results that were available during my care of the patient were reviewed by me and considered in my medical decision making (see chart for details).  62 year old female with PMH as noted above presents with increased numbness and weakness to bilateral upper and lower extremities over the last month, with bilateral shoulder pain but also acutely worsened right leg weakness today, which has caused her increased difficulty ambulating.  I reviewed the past medical records in [place]; the patient was most recently seen by her endocrinologist last month, and the note confirms history of poorly controlled diabetes on multiple medications, with A1c of 9.  The patient denies prior diagnosis of neuropathy.  On exam, due to the patient's obesity as well as her report of pain particular to bilateral shoulders  it is somewhat difficult to obtain a completely reliable neurologic exam.  She has no facial droop or other cranial nerve deficits.  She has minimally decreased grip strength and pain on abducting both arms, slightly worse on the right, but with no other focal aspect.  She does appear to have focal right leg weakness proximally to distally, and cannot lift it out of the wheelchair or plantarflex her foot.  Differential includes primarily diabetic neuropathy, radiculopathy, or spinal lesion.  Given the patient's poorly controlled diabetes, neuropathy is the most likely although this would be less likely to explain her motor symptoms or the asymmetrical nature of the weakness.  We will obtain labs as well as MRI of the whole spine as she also reports heaviness to bilateral arms and I cannot localize the deficit to  the lower spine.  At this time I do not suspect stroke given that she has no facial droop, ataxia, minimal upper extremity symptoms, and significant pain which is not consistent with stroke.  If the MRI shows no focal lesions, then we will work with the patient to determine the most appropriate disposition.  She may need to stay for PT evaluation and possible social work.  ----------------------------------------- 11:54 PM on 09/27/2017 -----------------------------------------  The MRIs are negative for acute neurologic findings that could be explain the patient's symptoms.  Her right lower extremity weakness has significantly improved and her pain is also improved after Toradol.  At this time, she is safe to go home given her current mobility and feels comfortable.  She would like to go home.  I counseled her on the results of the work-up.  I still suspect neuropathy is the primary etiology.  Since the Toradol helped I will prescribe her Naprosyn.  I instructed her via the ASL interpreter to follow-up with her PMD as she may need further work-up or medication as an outpatient.  Return precautions given, and she expressed understanding.  ____________________________________________   FINAL CLINICAL IMPRESSION(S) / ED DIAGNOSES  Final diagnoses:  Diabetic polyneuropathy associated with type 2 diabetes mellitus (HCC)  Right leg weakness  Bilateral arm pain      NEW MEDICATIONS STARTED DURING THIS VISIT:  New Prescriptions   NAPROXEN (NAPROSYN) 500 MG TABLET    Take 1 tablet (500 mg total) by mouth 2 (two) times daily with a meal for 15 days.     Note:  This document was prepared using Dragon voice recognition software and may include unintentional dictation errors.    Arta Silence, MD 09/27/17 2356

## 2017-09-27 NOTE — ED Notes (Signed)
Spoke with Alice Hughes in MRI, pt will have MRI after 8pm due to schedule. Informed him that the patient would require ASL interpreter and there is no family present that can interpreter for him.

## 2017-09-27 NOTE — ED Notes (Signed)
RN does not see pt in lobby. Pt is not sitting in the area she was in earlier today. Called for patient due to family with patient earlier today but no answer.

## 2017-10-14 DIAGNOSIS — R52 Pain, unspecified: Secondary | ICD-10-CM | POA: Diagnosis not present

## 2017-10-14 DIAGNOSIS — Z9181 History of falling: Secondary | ICD-10-CM | POA: Diagnosis not present

## 2017-10-14 DIAGNOSIS — Z23 Encounter for immunization: Secondary | ICD-10-CM | POA: Diagnosis not present

## 2017-10-14 DIAGNOSIS — M2550 Pain in unspecified joint: Secondary | ICD-10-CM | POA: Diagnosis not present

## 2017-10-14 DIAGNOSIS — R531 Weakness: Secondary | ICD-10-CM | POA: Diagnosis not present

## 2017-10-22 DIAGNOSIS — E1165 Type 2 diabetes mellitus with hyperglycemia: Secondary | ICD-10-CM | POA: Diagnosis not present

## 2017-10-22 DIAGNOSIS — R7982 Elevated C-reactive protein (CRP): Secondary | ICD-10-CM | POA: Diagnosis not present

## 2017-10-22 DIAGNOSIS — M159 Polyosteoarthritis, unspecified: Secondary | ICD-10-CM | POA: Insufficient documentation

## 2017-10-22 DIAGNOSIS — M2550 Pain in unspecified joint: Secondary | ICD-10-CM | POA: Diagnosis not present

## 2017-10-23 DIAGNOSIS — M2550 Pain in unspecified joint: Secondary | ICD-10-CM | POA: Diagnosis not present

## 2017-11-11 DIAGNOSIS — Z0000 Encounter for general adult medical examination without abnormal findings: Secondary | ICD-10-CM | POA: Diagnosis not present

## 2017-11-11 DIAGNOSIS — I10 Essential (primary) hypertension: Secondary | ICD-10-CM | POA: Diagnosis not present

## 2017-11-11 DIAGNOSIS — E1140 Type 2 diabetes mellitus with diabetic neuropathy, unspecified: Secondary | ICD-10-CM | POA: Diagnosis not present

## 2017-11-22 DIAGNOSIS — E1165 Type 2 diabetes mellitus with hyperglycemia: Secondary | ICD-10-CM | POA: Diagnosis not present

## 2017-11-26 ENCOUNTER — Ambulatory Visit: Payer: Medicare Other | Admitting: Physical Therapy

## 2017-11-27 DIAGNOSIS — E113293 Type 2 diabetes mellitus with mild nonproliferative diabetic retinopathy without macular edema, bilateral: Secondary | ICD-10-CM | POA: Diagnosis not present

## 2017-11-27 DIAGNOSIS — E1165 Type 2 diabetes mellitus with hyperglycemia: Secondary | ICD-10-CM | POA: Diagnosis not present

## 2017-11-27 DIAGNOSIS — E1169 Type 2 diabetes mellitus with other specified complication: Secondary | ICD-10-CM | POA: Diagnosis not present

## 2017-12-03 ENCOUNTER — Ambulatory Visit: Payer: Medicare Other | Admitting: Physical Therapy

## 2017-12-05 ENCOUNTER — Ambulatory Visit: Payer: Medicare Other | Admitting: Physical Therapy

## 2017-12-10 ENCOUNTER — Ambulatory Visit: Payer: Medicare Other | Admitting: Physical Therapy

## 2017-12-17 ENCOUNTER — Ambulatory Visit: Payer: Medicare Other | Admitting: Physical Therapy

## 2017-12-19 ENCOUNTER — Ambulatory Visit: Payer: Medicare Other | Admitting: Physical Therapy

## 2017-12-23 ENCOUNTER — Ambulatory Visit: Payer: Medicare Other | Admitting: Physical Therapy

## 2018-01-16 DIAGNOSIS — M059 Rheumatoid arthritis with rheumatoid factor, unspecified: Secondary | ICD-10-CM | POA: Insufficient documentation

## 2018-02-14 ENCOUNTER — Other Ambulatory Visit: Payer: Self-pay | Admitting: Family Medicine

## 2018-02-14 DIAGNOSIS — Z1231 Encounter for screening mammogram for malignant neoplasm of breast: Secondary | ICD-10-CM

## 2018-03-03 DIAGNOSIS — M791 Myalgia, unspecified site: Secondary | ICD-10-CM

## 2018-03-03 DIAGNOSIS — E1165 Type 2 diabetes mellitus with hyperglycemia: Secondary | ICD-10-CM | POA: Insufficient documentation

## 2018-03-03 DIAGNOSIS — E1169 Type 2 diabetes mellitus with other specified complication: Secondary | ICD-10-CM | POA: Insufficient documentation

## 2018-03-03 DIAGNOSIS — E669 Obesity, unspecified: Secondary | ICD-10-CM | POA: Insufficient documentation

## 2018-03-03 HISTORY — DX: Myalgia, unspecified site: M79.10

## 2018-03-06 ENCOUNTER — Ambulatory Visit
Admission: RE | Admit: 2018-03-06 | Discharge: 2018-03-06 | Disposition: A | Discharge status: Home / Self Care | Payer: Medicare Other | Source: Ambulatory Visit | Attending: Family Medicine | Admitting: Family Medicine

## 2018-03-06 DIAGNOSIS — Z1231 Encounter for screening mammogram for malignant neoplasm of breast: Secondary | ICD-10-CM | POA: Insufficient documentation

## 2019-02-16 DIAGNOSIS — R05 Cough: Secondary | ICD-10-CM | POA: Diagnosis not present

## 2019-02-16 DIAGNOSIS — E1165 Type 2 diabetes mellitus with hyperglycemia: Secondary | ICD-10-CM | POA: Diagnosis not present

## 2019-02-16 DIAGNOSIS — R0602 Shortness of breath: Secondary | ICD-10-CM | POA: Diagnosis not present

## 2019-02-16 DIAGNOSIS — Z794 Long term (current) use of insulin: Secondary | ICD-10-CM | POA: Diagnosis not present

## 2019-02-25 DIAGNOSIS — Z794 Long term (current) use of insulin: Secondary | ICD-10-CM | POA: Diagnosis not present

## 2019-02-25 DIAGNOSIS — E1140 Type 2 diabetes mellitus with diabetic neuropathy, unspecified: Secondary | ICD-10-CM | POA: Diagnosis not present

## 2019-02-25 DIAGNOSIS — R609 Edema, unspecified: Secondary | ICD-10-CM | POA: Diagnosis not present

## 2019-02-25 DIAGNOSIS — R0602 Shortness of breath: Secondary | ICD-10-CM | POA: Diagnosis not present

## 2019-03-03 IMAGING — MR MR THORACIC SPINE W/O CM
5 of 6 series · 26 of 48 positions shown · non-contrast
Comparison: None.

CLINICAL DATA: RIGHT leg weakness and fall today, decreasing
mobility and worsening numbness for months. Status post neck
injection. Bilateral arm pain. History of hypertension, diabetes.

EXAM:
MRI CERVICAL, THORACIC AND LUMBAR SPINE WITHOUT CONTRAST
TECHNIQUE: Multiplanar and multiecho pulse sequences of the cervical spine, to
include the craniocervical junction and cervicothoracic junction,
and thoracic and lumbar spine, were obtained without intravenous
contrast. Axial T1 sequences of the lumbar spine not obtained,
patient could not tolerate further imaging.

[Series 29: T1 · sagittal · 5.0mm · 1.88mm/px · 2 of 9 slices shown (1 of 2)]
[im 1/9]
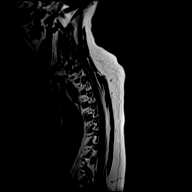
[im 9/9]
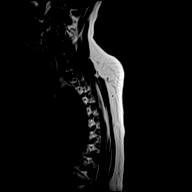

[Series 30: T2 · sagittal · 3.0mm · 1.06mm/px · 6 of 17 slices shown (1 of 2)]
[im 1/17]
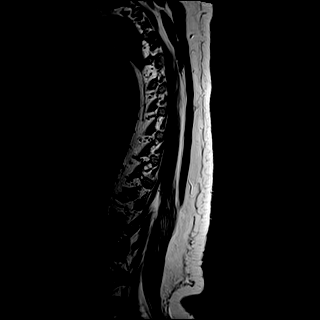
[im 4/17]
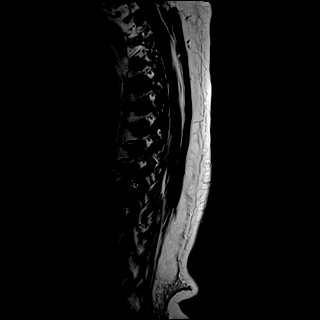
[im 7/17]
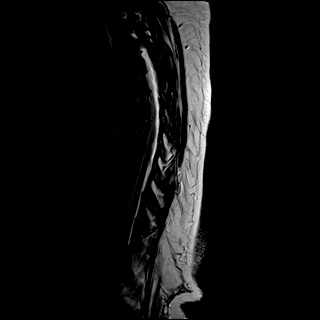
[im 10/17]
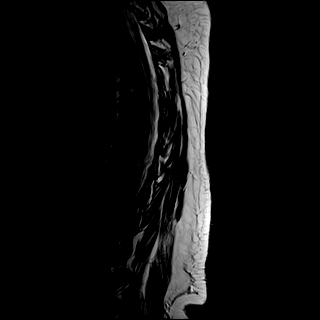
[im 13/17]
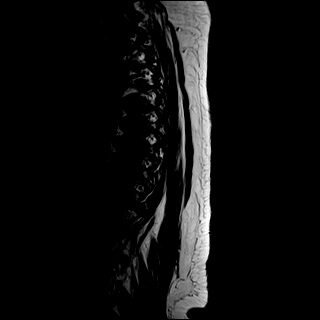
[im 17/17]
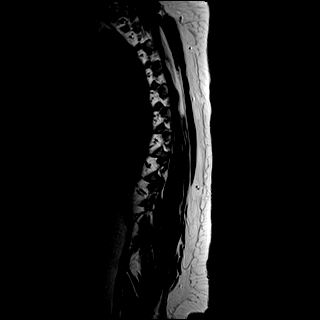

[Series 31: T1 · sagittal · 3.0mm · 1.06mm/px · 6 of 17 slices shown (2 of 2)]
[im 1/17]
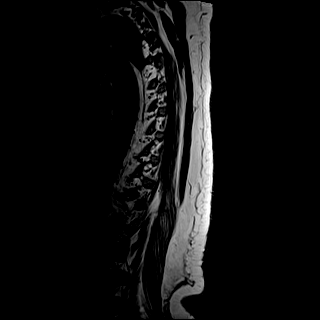
[im 4/17]
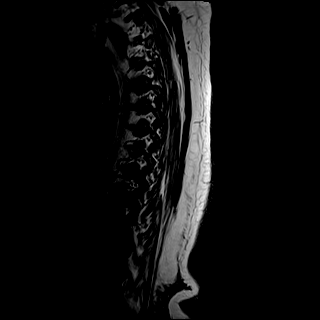
[im 7/17]
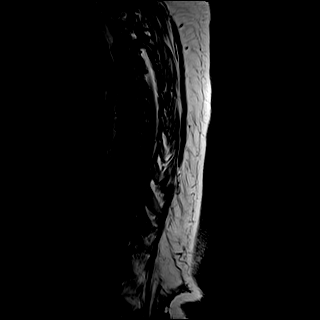
[im 10/17]
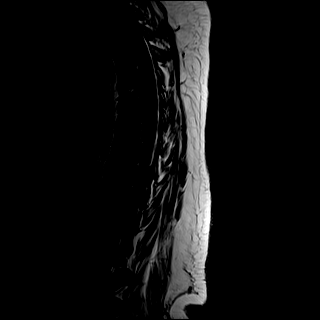
[im 13/17]
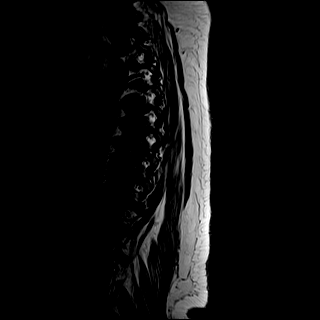
[im 17/17]
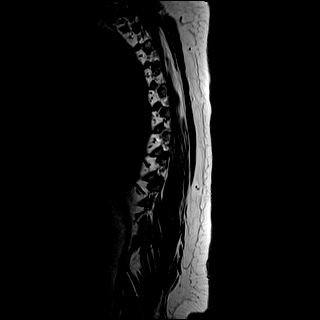

[Series 32: STIR · sagittal · 3.0mm · 0.53mm/px · 4 of 17 slices shown]
[im 1/17]
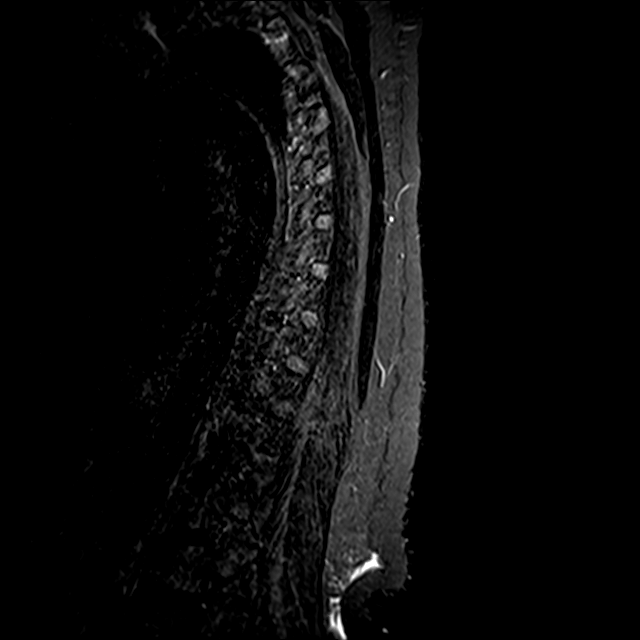
[im 4/17]
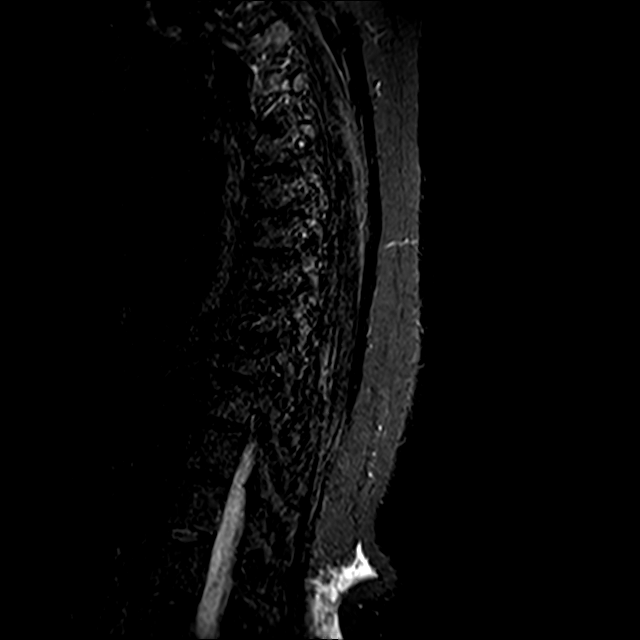
[im 7/17]
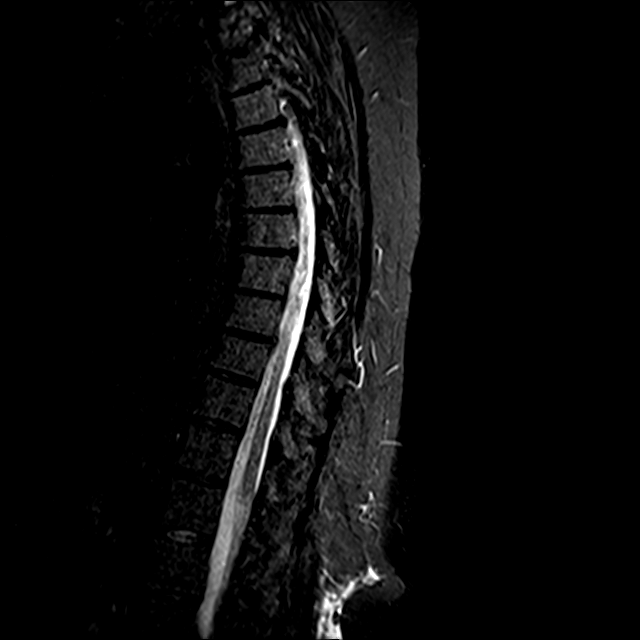
[im 10/17]
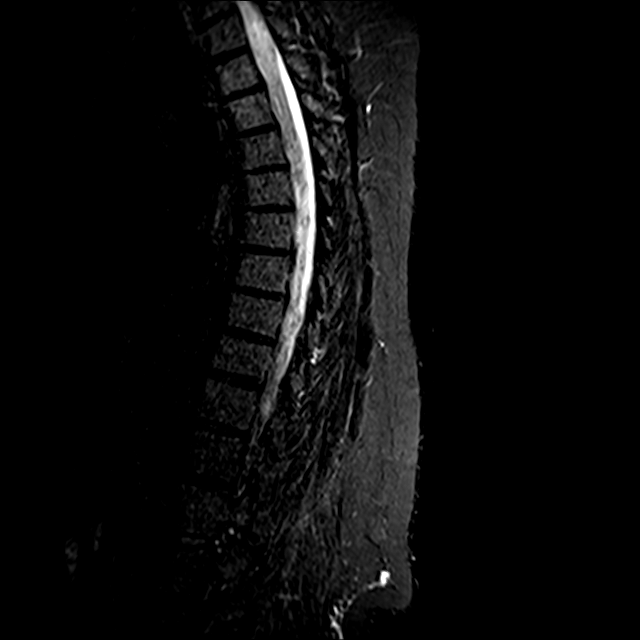

[Series 33: T2 · axial · 4.0mm · 0.59mm/px · z∈[-303,-67]mm · 8 of 39 slices shown (2 of 2)]
[im 1/39]
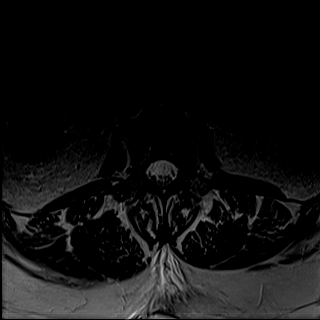
[im 6/39]
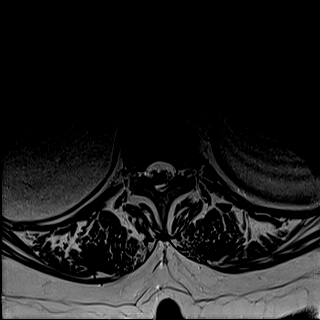
[im 12/39]
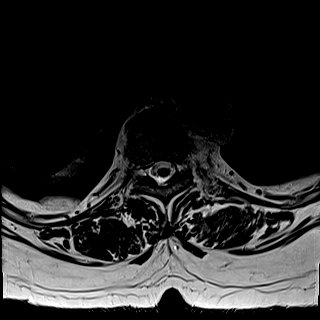
[im 18/39]
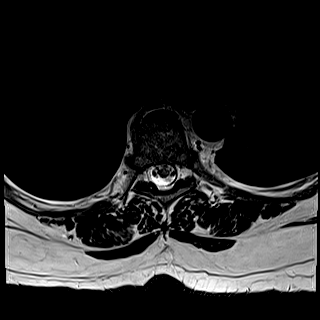
[im 21/39]
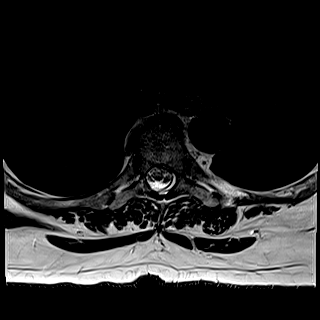
[im 27/39]
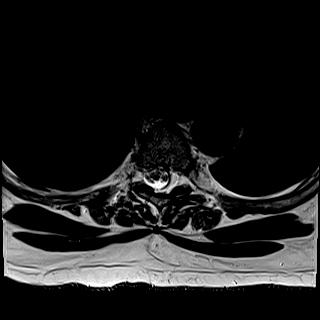
[im 33/39]
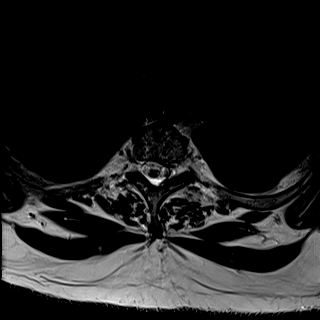
[im 39/39]
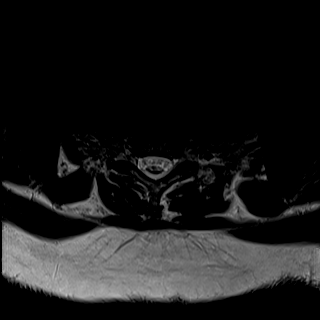

[26 of 48 positions shown; findings below may reference images not displayed]

FINDINGS: MRI CERVICAL SPINE FINDINGS-mildly motion degraded examination.

ALIGNMENT: Straightened cervical lordosis.  No malalignment.

VERTEBRAE/DISCS: Vertebral bodies are intact. Moderate C5-6,
moderate to severe C6-7 disc height loss with moderate subacute
discogenic endplate changes. Multilevel mild chronic discogenic
endplate changes. No acute or abnormal bone marrow signal. Thickened
posterior longitudinal ligament, possible OPLL.

CORD:Increased T2 signal and cord volume loss at C6-7 2 C7-T1, 24 mm
in craniocaudad dimension consistent with myelomalacia. No syrinx.

POSTERIOR FOSSA, VERTEBRAL ARTERIES, PARASPINAL TISSUES: No MR
findings of ligamentous injury. Vertebral artery flow voids present.
Included posterior fossa and paraspinal soft tissues are normal.

DISC LEVELS:

C2-3: Small central disc protrusion. No canal stenosis or neural
foraminal narrowing.

C3-4: Small broad-based disc bulge, superimposed moderate LEFT
subarticular disc protrusion, uncovertebral hypertrophy mild facet
arthropathy. Mild canal stenosis. Moderate to severe LEFT neural
foraminal narrowing.

C3-4: Annular bulging, uncovertebral hypertrophy. No canal stenosis
or neural foraminal narrowing.

C5-6: Small broad-based disc bulge, superimposed moderate LEFT
central disc protrusion. Uncovertebral hypertrophy. Mild canal
stenosis with LEFT ventral spinal cord deformity. Moderate LEFT
neural foraminal narrowing.

C6-7: Small to moderate broad-based disc bulge eccentric laterally.
Uncovertebral hypertrophy. No canal stenosis. Moderate bilateral
neural foraminal narrowing.

C7-T1: No disc bulge, canal stenosis nor neural foraminal narrowing.

MRI THORACIC SPINE FINDINGS

ALIGNMENT: Maintenance of the thoracic kyphosis. No malalignment.

VERTEBRAE/DISCS: Vertebral bodies are intact. Very mild upper
thoracic disc height loss compatible with degenerative discs,
multilevel mild chronic discogenic endplate changes and diffuse mild
disc desiccation. No abnormal or acute bone marrow signal.

CORD: Mild possible upper thoracic myelomalacia though limited due
to motion. No cord edema or syrinx.

PREVERTEBRAL AND PARASPINAL SOFT TISSUES: Nonacute. Mild symmetric
paraspinal muscle atrophy.

DISC LEVELS:

T2-3: Small LEFT central disc protrusion without canal stenosis or
neural foraminal narrowing.

T3-4: Small to moderate LEFT central disc protrusion without canal
stenosis or neural foraminal narrowing.

T4-5, T5-6: Small central disc protrusion without canal stenosis or
neural foraminal narrowing.

T6-7: Small broad-based central disc protrusion without canal
stenosis or neural foraminal narrowing.

T7-8: Small RIGHT central disc protrusion without canal stenosis or
neural foraminal narrowing.

T8-9: Small to moderate central disc protrusion without canal
stenosis or neural foraminal narrowing.

No disc bulge, canal stenosis nor neural foraminal narrowing at
remaining levels.

MRI LUMBAR SPINE FINDINGS

SEGMENTATION: For the purposes of this report, transitional anatomy
with lumbarized S1 vertebral body.

ALIGNMENT: Maintained lumbar lordosis. Grade 1 L5-S1 anterolisthesis
without spondylolysis.

VERTEBRAE:Vertebral bodies are intact. Intervertebral discs
demonstrate normal morphology, mild disc desiccation. Minimal lower
lumbar subacute discogenic endplate changes. No acute or abnormal
bone marrow signal.

CONUS MEDULLARIS AND CAUDA EQUINA: Conus medullaris terminates at
T12-L1 and demonstrates normal morphology and signal
characteristics. Peripheral distribution of cauda equina at
lumbosacral junction.

PARASPINAL AND OTHER SOFT TISSUES: Nonacute. Moderate symmetric
paraspinal muscle atrophy.

DISC LEVELS:

T12-L1 through L3-4: No disc bulge, canal stenosis nor neural
foraminal narrowing.

L4-5: No disc bulge, canal stenosis nor neural foraminal narrowing.
Moderate facet arthropathy.

L5-S1: Anterolisthesis. Severe facet arthropathy. No canal stenosis
or neural foraminal narrowing.
IMPRESSION: MRI cervical spine:

1. Motion degraded examination. No fracture, malalignment or acute
osseous process.
2. 24 mm lower cervical myelomalacia. No syrinx or spinal cord
edema.
3. Degenerative change of the cervical spine. Mild canal stenosis
C3-4 and C5-6.
4. Multilevel neural foraminal narrowing: Moderate to severe on the
LEFT at C3-4.

MRI thoracic spine:

1. Motion degraded examination. No fracture, malalignment or acute
osseous process.
2. Mild upper thoracic myelomalacia versus motion artifact. No
syrinx or spinal cord edema.
3. Degenerative change of the thoracic spine with multilevel small
and small to moderate disc protrusions.
4. No canal stenosis or neural foraminal narrowing.

MRI lumbar spine:

1. No fracture or acute osseous process. Grade 1 L5-S1
anterolisthesis without spondylolysis.
2. Degenerative change of the lumbar spine. Severe L5-S1 facet
arthropathy.
3. No canal stenosis or neural foraminal narrowing.
4. Peripheral distribution of lumbosacral cauda equina, possible old
arachnoiditis.

## 2019-03-16 DIAGNOSIS — R0602 Shortness of breath: Secondary | ICD-10-CM | POA: Diagnosis not present

## 2019-03-16 DIAGNOSIS — R609 Edema, unspecified: Secondary | ICD-10-CM | POA: Diagnosis not present

## 2019-03-26 DIAGNOSIS — I5032 Chronic diastolic (congestive) heart failure: Secondary | ICD-10-CM | POA: Diagnosis not present

## 2019-03-26 DIAGNOSIS — E1165 Type 2 diabetes mellitus with hyperglycemia: Secondary | ICD-10-CM | POA: Diagnosis not present

## 2019-03-26 DIAGNOSIS — I272 Pulmonary hypertension, unspecified: Secondary | ICD-10-CM | POA: Diagnosis not present

## 2019-03-31 DIAGNOSIS — E1165 Type 2 diabetes mellitus with hyperglycemia: Secondary | ICD-10-CM | POA: Diagnosis not present

## 2019-04-07 DIAGNOSIS — E1121 Type 2 diabetes mellitus with diabetic nephropathy: Secondary | ICD-10-CM | POA: Diagnosis not present

## 2019-04-07 DIAGNOSIS — E669 Obesity, unspecified: Secondary | ICD-10-CM | POA: Diagnosis not present

## 2019-04-07 DIAGNOSIS — E1165 Type 2 diabetes mellitus with hyperglycemia: Secondary | ICD-10-CM | POA: Diagnosis not present

## 2019-04-07 DIAGNOSIS — E113293 Type 2 diabetes mellitus with mild nonproliferative diabetic retinopathy without macular edema, bilateral: Secondary | ICD-10-CM | POA: Diagnosis not present

## 2019-04-07 DIAGNOSIS — E1169 Type 2 diabetes mellitus with other specified complication: Secondary | ICD-10-CM | POA: Diagnosis not present

## 2019-04-07 DIAGNOSIS — Z794 Long term (current) use of insulin: Secondary | ICD-10-CM | POA: Diagnosis not present

## 2019-04-16 DIAGNOSIS — J810 Acute pulmonary edema: Secondary | ICD-10-CM | POA: Diagnosis not present

## 2019-04-16 DIAGNOSIS — M7989 Other specified soft tissue disorders: Secondary | ICD-10-CM | POA: Diagnosis not present

## 2019-05-06 ENCOUNTER — Other Ambulatory Visit: Payer: Self-pay | Admitting: Pulmonary Disease

## 2019-05-06 DIAGNOSIS — M7989 Other specified soft tissue disorders: Secondary | ICD-10-CM

## 2019-05-07 ENCOUNTER — Other Ambulatory Visit
Admission: RE | Admit: 2019-05-07 | Discharge: 2019-05-07 | Disposition: A | Discharge status: Home / Self Care | Payer: Medicare HMO | Source: Ambulatory Visit | Attending: Rehabilitative and Restorative Service Providers" | Admitting: Rehabilitative and Restorative Service Providers"

## 2019-05-07 DIAGNOSIS — E1165 Type 2 diabetes mellitus with hyperglycemia: Secondary | ICD-10-CM | POA: Diagnosis not present

## 2019-05-07 DIAGNOSIS — I5032 Chronic diastolic (congestive) heart failure: Secondary | ICD-10-CM | POA: Diagnosis not present

## 2019-05-07 DIAGNOSIS — R0602 Shortness of breath: Secondary | ICD-10-CM | POA: Diagnosis not present

## 2019-05-07 DIAGNOSIS — I272 Pulmonary hypertension, unspecified: Secondary | ICD-10-CM | POA: Diagnosis not present

## 2019-05-07 LAB — BRAIN NATRIURETIC PEPTIDE: B Natriuretic Peptide: 541 pg/mL — ABNORMAL HIGH (ref 0.0–100.0)

## 2019-05-08 ENCOUNTER — Other Ambulatory Visit: Payer: Self-pay | Admitting: Pulmonary Disease

## 2019-05-08 DIAGNOSIS — M7989 Other specified soft tissue disorders: Secondary | ICD-10-CM

## 2019-05-15 ENCOUNTER — Other Ambulatory Visit
Admission: RE | Admit: 2019-05-15 | Discharge: 2019-05-15 | Disposition: A | Discharge status: Home / Self Care | Payer: Medicare HMO | Source: Ambulatory Visit | Attending: Cardiology | Admitting: Cardiology

## 2019-05-15 ENCOUNTER — Other Ambulatory Visit: Payer: Self-pay

## 2019-05-15 DIAGNOSIS — Z01812 Encounter for preprocedural laboratory examination: Secondary | ICD-10-CM | POA: Diagnosis not present

## 2019-05-15 DIAGNOSIS — Z20822 Contact with and (suspected) exposure to covid-19: Secondary | ICD-10-CM | POA: Diagnosis not present

## 2019-05-15 LAB — SARS CORONAVIRUS 2 (TAT 6-24 HRS): SARS Coronavirus 2: NEGATIVE

## 2019-05-18 ENCOUNTER — Other Ambulatory Visit: Payer: Self-pay | Admitting: Cardiology

## 2019-05-18 MED ORDER — SODIUM CHLORIDE 0.9% FLUSH: 3.0000 mL | Freq: Two times a day (BID) | INTRAVENOUS | Status: AC

## 2019-05-19 ENCOUNTER — Other Ambulatory Visit: Payer: Self-pay

## 2019-05-19 ENCOUNTER — Encounter
Admission: RE | Disposition: A | Discharge status: Home / Self Care | Payer: Self-pay | Source: Home / Self Care | Attending: Cardiology

## 2019-05-19 ENCOUNTER — Encounter: Payer: Self-pay | Admitting: Cardiology

## 2019-05-19 ENCOUNTER — Ambulatory Visit
Admission: RE | Admit: 2019-05-19 | Discharge: 2019-05-19 | Disposition: A | Discharge status: Home / Self Care | Payer: Medicare HMO | Attending: Cardiology | Admitting: Cardiology

## 2019-05-19 DIAGNOSIS — E11319 Type 2 diabetes mellitus with unspecified diabetic retinopathy without macular edema: Secondary | ICD-10-CM | POA: Diagnosis not present

## 2019-05-19 DIAGNOSIS — Z6841 Body Mass Index (BMI) 40.0 and over, adult: Secondary | ICD-10-CM | POA: Diagnosis not present

## 2019-05-19 DIAGNOSIS — I272 Pulmonary hypertension, unspecified: Secondary | ICD-10-CM | POA: Diagnosis not present

## 2019-05-19 DIAGNOSIS — E1165 Type 2 diabetes mellitus with hyperglycemia: Secondary | ICD-10-CM | POA: Insufficient documentation

## 2019-05-19 DIAGNOSIS — G373 Acute transverse myelitis in demyelinating disease of central nervous system: Secondary | ICD-10-CM | POA: Insufficient documentation

## 2019-05-19 DIAGNOSIS — Z79899 Other long term (current) drug therapy: Secondary | ICD-10-CM | POA: Insufficient documentation

## 2019-05-19 DIAGNOSIS — Z794 Long term (current) use of insulin: Secondary | ICD-10-CM | POA: Diagnosis not present

## 2019-05-19 DIAGNOSIS — I270 Primary pulmonary hypertension: Secondary | ICD-10-CM | POA: Diagnosis not present

## 2019-05-19 DIAGNOSIS — M069 Rheumatoid arthritis, unspecified: Secondary | ICD-10-CM | POA: Insufficient documentation

## 2019-05-19 DIAGNOSIS — I5032 Chronic diastolic (congestive) heart failure: Secondary | ICD-10-CM | POA: Diagnosis not present

## 2019-05-19 HISTORY — DX: Obesity, unspecified: E66.9

## 2019-05-19 HISTORY — DX: Heart failure, unspecified: I50.9

## 2019-05-19 HISTORY — DX: Unspecified osteoarthritis, unspecified site: M19.90

## 2019-05-19 HISTORY — DX: Unspecified hearing loss, unspecified ear: H91.90

## 2019-05-19 HISTORY — DX: Acute transverse myelitis in demyelinating disease of central nervous system: G37.3

## 2019-05-19 HISTORY — DX: Calculus of gallbladder without cholecystitis without obstruction: K80.20

## 2019-05-19 HISTORY — PX: RIGHT HEART CATH AND CORONARY ANGIOGRAPHY: CATH118264

## 2019-05-19 HISTORY — DX: Rheumatoid arthritis, unspecified: M06.9

## 2019-05-19 LAB — GLUCOSE, CAPILLARY: Glucose-Capillary: 128 mg/dL — ABNORMAL HIGH (ref 70–99)

## 2019-05-19 SURGERY — RIGHT HEART CATH AND CORONARY ANGIOGRAPHY
Anesthesia: Moderate Sedation | Laterality: Right

## 2019-05-19 MED ORDER — SODIUM CHLORIDE 0.9% FLUSH: 3.0000 mL | Freq: Two times a day (BID) | INTRAVENOUS | Status: DC

## 2019-05-19 MED ORDER — SODIUM CHLORIDE 0.9 % IV SOLN: 250.0000 mL | INTRAVENOUS | Status: DC | PRN

## 2019-05-19 MED ORDER — SODIUM CHLORIDE 0.9 % IV SOLN: INTRAVENOUS | Status: DC

## 2019-05-19 MED ORDER — ONDANSETRON HCL 4 MG/2ML IJ SOLN: 4.0000 mg | Freq: Four times a day (QID) | INTRAMUSCULAR | Status: DC | PRN

## 2019-05-19 MED ORDER — ACETAMINOPHEN 325 MG PO TABS: 650.0000 mg | ORAL_TABLET | ORAL | Status: DC | PRN

## 2019-05-19 MED ORDER — ASPIRIN 81 MG PO CHEW
CHEWABLE_TABLET | ORAL | Status: AC
  Administered 2019-05-19: 81 mg via ORAL
  Filled 2019-05-19: qty 1

## 2019-05-19 MED ORDER — ASPIRIN 81 MG PO CHEW: 81.0000 mg | CHEWABLE_TABLET | ORAL | Status: AC

## 2019-05-19 MED ORDER — HYDRALAZINE HCL 20 MG/ML IJ SOLN: 10.0000 mg | INTRAMUSCULAR | Status: DC | PRN

## 2019-05-19 MED ORDER — SODIUM CHLORIDE 0.9% FLUSH: 3.0000 mL | INTRAVENOUS | Status: DC | PRN

## 2019-05-19 MED ORDER — LABETALOL HCL 5 MG/ML IV SOLN: 10.0000 mg | INTRAVENOUS | Status: DC | PRN

## 2019-05-19 MED ORDER — HEPARIN (PORCINE) IN NACL 1000-0.9 UT/500ML-% IV SOLN
INTRAVENOUS | Status: DC | PRN
  Administered 2019-05-19: 500 mL

## 2019-05-19 SURGICAL SUPPLY — 13 items
CATH INFINITI 5FR JL4 (CATHETERS) IMPLANT
CATH INFINITI JR4 5F (CATHETERS) IMPLANT
CATH SWANZ 7F THERMO (CATHETERS) ×2 IMPLANT
GUIDEWIRE .025 260CM (WIRE) ×2 IMPLANT
GUIDEWIRE EMER 3M J .025X150CM (WIRE) ×2 IMPLANT
KIT MANI 3VAL PERCEP (MISCELLANEOUS) ×2 IMPLANT
NEEDLE PERC 18GX7CM (NEEDLE) ×2 IMPLANT
PACK CARDIAC CATH (CUSTOM PROCEDURE TRAY) ×2 IMPLANT
PANNUS RETENTION SYSTEM 2 PAD (MISCELLANEOUS) ×2 IMPLANT
SHEATH AVANTI 5FR X 11CM (SHEATH) IMPLANT
SHEATH AVANTI 7FRX11 (SHEATH) ×2 IMPLANT
WIRE GUIDERIGHT .035X150 (WIRE) IMPLANT
WIRE HITORQ VERSACORE ST 145CM (WIRE) ×2 IMPLANT

## 2019-05-19 NOTE — Discharge Instructions (Signed)
Femoral Site Care This sheet gives you information about how to care for yourself after your procedure. Your health care provider may also give you more specific instructions. If you have problems or questions, contact your health care provider. What can I expect after the procedure? After the procedure, it is common to have:  Bruising that usually fades within 1-2 weeks.  Tenderness at the site. Follow these instructions at home: Wound care  Follow instructions from your health care provider about how to take care of your insertion site. Make sure you: ? Wash your hands with soap and water before you change your bandage (dressing). If soap and water are not available, use hand sanitizer. ? Change your dressing as told by your health care provider. ? Leave stitches (sutures), skin glue, or adhesive strips in place. These skin closures may need to stay in place for 2 weeks or longer. If adhesive strip edges start to loosen and curl up, you may trim the loose edges. Do not remove adhesive strips completely unless your health care provider tells you to do that.  Do not take baths, swim, or use a hot tub until your health care provider approves.  You may shower 24-48 hours after the procedure or as told by your health care provider. ? Gently wash the site with plain soap and water. ? Pat the area dry with a clean towel. ? Do not rub the site. This may cause bleeding.  Do not apply powder or lotion to the site. Keep the site clean and dry.  Check your femoral site every day for signs of infection. Check for: ? Redness, swelling, or pain. ? Fluid or blood. ? Warmth. ? Pus or a bad smell. Activity  For the first 2-3 days after your procedure, or as long as directed: ? Avoid climbing stairs as much as possible. ? Do not squat.  Do not lift anything that is heavier than 10 lb (4.5 kg), or the limit that you are told, until your health care provider says that it is safe.  Rest as  directed. ? Avoid sitting for a long time without moving. Get up to take short walks every 1-2 hours.  Do not drive for 24 hours if you were given a medicine to help you relax (sedative). General instructions  Take over-the-counter and prescription medicines only as told by your health care provider.  Keep all follow-up visits as told by your health care provider. This is important. Contact a health care provider if you have:  A fever or chills.  You have redness, swelling, or pain around your insertion site. Get help right away if:  The catheter insertion area swells very fast.  You pass out.  You suddenly start to sweat or your skin gets clammy.  The catheter insertion area is bleeding, and the bleeding does not stop when you hold steady pressure on the area.  The area near or just beyond the catheter insertion site becomes pale, cool, tingly, or numb. These symptoms may represent a serious problem that is an emergency. Do not wait to see if the symptoms will go away. Get medical help right away. Call your local emergency services (911 in the [place] Do not drive yourself to the hospital. Summary  After the procedure, it is common to have bruising that usually fades within 1-2 weeks.  Check your femoral site every day for signs of infection.  Do not lift anything that is heavier than 10 lb (4.5 kg), or the   limit that you are told, until your health care provider says that it is safe. This information is not intended to replace advice given to you by your health care provider. Make sure you discuss any questions you have with your health care provider. Document Revised: 12/31/2016 Document Reviewed: 12/31/2016 Elsevier Patient Education  2020 [employer] Moderate Conscious Sedation, Adult, Care After These instructions provide you with information about caring for yourself after your procedure. Your health care provider may also give you more specific instructions. Your  treatment has been planned according to current medical practices, but problems sometimes occur. Call your health care provider if you have any problems or questions after your procedure. What can I expect after the procedure? After your procedure, it is common:  To feel sleepy for several hours.  To feel clumsy and have poor balance for several hours.  To have poor judgment for several hours.  To vomit if you eat too soon. Follow these instructions at home: For at least 24 hours after the procedure:   Do not: ? Participate in activities where you could fall or become injured. ? Drive. ? Use heavy machinery. ? Drink alcohol. ? Take sleeping pills or medicines that cause drowsiness. ? Make important decisions or sign legal documents. ? Take care of children on your own.  Rest. Eating and drinking  Follow the diet recommended by your health care provider.  If you vomit: ? Drink water, juice, or soup when you can drink without vomiting. ? Make sure you have little or no nausea before eating solid foods. General instructions  Have a responsible adult stay with you until you are awake and alert.  Take over-the-counter and prescription medicines only as told by your health care provider.  If you smoke, do not smoke without supervision.  Keep all follow-up visits as told by your health care provider. This is important. Contact a health care provider if:  You keep feeling nauseous or you keep vomiting.  You feel light-headed.  You develop a rash.  You have a fever. Get help right away if:  You have trouble breathing. This information is not intended to replace advice given to you by your health care provider. Make sure you discuss any questions you have with your health care provider. Document Revised: 11/30/2016 Document Reviewed: 04/09/2015 Elsevier Patient Education  2020 [employer] Angiogram, Care After This sheet gives you information about how to care for  yourself after your procedure. Your doctor may also give you more specific instructions. If you have problems or questions, contact your doctor. Follow these instructions at home: Insertion site care  Follow instructions from your doctor about how to take care of your long, thin tube (catheter) insertion area. Make sure you: ? Wash your hands with soap and water before you change your bandage (dressing). If you cannot use soap and water, use hand sanitizer. ? Change your bandage as told by your doctor. ? Leave stitches (sutures), skin glue, or skin tape (adhesive) strips in place. They may need to stay in place for 2 weeks or longer. If tape strips get loose and curl up, you may trim the loose edges. Do not remove tape strips completely unless your doctor says it is okay.  Do not take baths, swim, or use a hot tub until your doctor says it is okay.  You may shower 24-48 hours after the procedure or as told by your doctor. ? Gently wash the area with plain soap and water. ?   Pat the area dry with a clean towel. ? Do not rub the area. This may cause bleeding.  Do not apply powder or lotion to the area. Keep the area clean and dry.  Check your insertion area every day for signs of infection. Check for: ? More redness, swelling, or pain. ? Fluid or blood. ? Warmth. ? Pus or a bad smell. Activity  Rest as told by your doctor, usually for 1-2 days.  Do not lift anything that is heavier than 10 lbs. (4.5 kg) or as told by your doctor.  Do not drive for 24 hours if you were given a medicine to help you relax (sedative).  Do not drive or use heavy machinery while taking prescription pain medicine. General instructions   Go back to your normal activities as told by your doctor, usually in about a week. Ask your doctor what activities are safe for you.  If the insertion area starts to bleed, lie flat and put pressure on the area. If the bleeding does not stop, get help right away. This is an  emergency.  Drink enough fluid to keep your pee (urine) clear or pale yellow.  Take over-the-counter and prescription medicines only as told by your doctor.  Keep all follow-up visits as told by your doctor. This is important. Contact a doctor if:  You have a fever.  You have chills.  You have more redness, swelling, or pain around your insertion area.  You have fluid or blood coming from your insertion area.  The insertion area feels warm to the touch.  You have pus or a bad smell coming from your insertion area.  You have more bruising around the insertion area.  Blood collects in the tissue around the insertion area (hematoma) that may be painful to the touch. Get help right away if:  You have a lot of pain in the insertion area.  The insertion area swells very fast.  The insertion area is bleeding, and the bleeding does not stop after holding steady pressure on the area.  The area near or just beyond the insertion area becomes pale, cool, tingly, or numb. These symptoms may be an emergency. Do not wait to see if the symptoms will go away. Get medical help right away. Call your local emergency services (911 in the [place] Do not drive yourself to the hospital. Summary  After the procedure, it is common to have bruising and tenderness at the long, thin tube insertion area.  After the procedure, it is important to rest and drink plenty of fluids.  Do not take baths, swim, or use a hot tub until your doctor says it is okay to do so. You may shower 24-48 hours after the procedure or as told by your doctor.  If the insertion area starts to bleed, lie flat and put pressure on the area. If the bleeding does not stop, get help right away. This is an emergency. This information is not intended to replace advice given to you by your health care provider. Make sure you discuss any questions you have with your health care provider. Document Revised: 11/30/2016 Document Reviewed:  12/13/2015 Elsevier Patient Education  2020 [employer]  

## 2019-05-19 NOTE — H&P (Signed)
Chief Complaint: Chief Complaint  Patient presents with  . 2 month followup  Date of Service: 05/07/2019 Date of Birth: [birth year] PCP: Lovie Macadamia, MD  History of Present Illness: Ms. Kwiecinski is a 64 y.o.female patient with a past medical history significant for newly diagnosed HFpEF, type 2 diabetes and morbid obesity who presents to review echocardiogram results. The history was obtained by the patient via assistance from the sign language interpreter. She was recently evaluated by PCP for intermittent episodes of shortness of breath as well as lower extremity swelling. Chest xray revealed mild interstitial edema and she was started on Lasix 31m daily. She presents today and reports a several day history of worsening shortness of breath and lower extremity swelling. She was started on metolazone per pulmonology, which she has been compliant with, but she self-discontinued her Lasix. She has not been weighing herself daily. She denies chest pain or chest pressure. She denies syncopal/presyncopal events.   Echocardiogram on 04/16/19 revealed normal LV systolic function with an EF estimated greater than 55% with moderate RV systolic dysfunction with moderate TR, estimated RVSP at 735mg. Moderate RV enlargement.   Past Medical and Surgical History  Past Medical History Past Medical History:  Diagnosis Date  . Arthritis  . Diabetes with retinopathy  . Gallstones  . Hearing impaired  . Heart failure (CMS-HCC)  . Morbid obesity (CMS-HCC)  . Rheumatoid arthritis (CMS-HCC)  . Transverse myelitis (CMS-HCC)  . Type 2 diabetes mellitus (CMS-HCC)   Past Surgical History She has a past surgical history that includes Cholecystectomy ('91); hysterectomy vaginal; pneumovax (2009); and Hysterectomy.   Medications and Allergies  Current Medications  Current Outpatient Medications  Medication Sig Dispense Refill  . alcohol swabs PadM Apply 1 each topically 3 (three) times a day 300 each 1  .  blood glucose control, normal Soln Use 1 each once a week 3 each 1  . blood glucose diagnostic (TRUE METRIX GLUCOSE TEST STRIP) test strip 1 each (1 strip total) by XX route 3 (three) times daily Use as instructed. 300 each 1  . blood glucose meter kit by XX route as directed 1 each 0  . folic acid (FOLVITE) 1 MG tablet 1 tab daily, 90 days - 90 tabs 90 tablet 4  . FUROsemide (LASIX) 20 MG tablet Take 1 tablet (20 mg total) by mouth once daily 30 tablet 11  . gabapentin (NEURONTIN) 300 MG capsule Take 300 mg twice a day, 90 days, 180 tabs 180 capsule 4  . glimepiride (AMARYL) 4 MG tablet Take 1 tablet (4 mg total) by mouth daily with breakfast 90 tablet 3  . insulin GLARGINE (LANTUS SOLOSTAR U-100 INSULIN) pen injector (concentration 100 units/mL) Inject 70 Units subcutaneously nightly 30 mL 9  . insulin REGULAR (HUMULIN R REGULAR U-100 INSULN) injection (concentration 100 units/mL) Take 25 units before morning meal. Take 35 units before supper meal. 10 mL 9  . insulin REGULAR (HUMULIN R) injection (concentration 100 units/mL) Inject 25 units before morning meal and 35 units before supper meal- DISPENSE NOVOLIN R U 100 10 mL 9  . insulin syringe-needle U-100 0.5 mL 31 gauge x 5/16" syringe Use as directed Twice daily 200 each 3  . lancets Use 1 each 3 (three) times daily Use as instructed. 300 each 1  . lisinopriL (ZESTRIL) 20 MG tablet Take 1 tablet (20 mg total) by mouth once daily 90 tablet 3  . metFORMIN (GLUCOPHAGE) 1000 MG tablet Take 1 tablet (1,000 mg total) by mouth 2 (  two) times daily with meals 180 tablet 1  . methotrexate (RHEUMATREX) 2.5 MG tablet TAKE 6 TABLETS BY MOUTH ONCE WEEKLY 72 tablet 4  . metOLazone (ZAROXOLYN) 5 MG tablet Take 1 tablet (5 mg total) by mouth once daily 30 tablet 0  . pen needle, diabetic (BD ULTRA-FINE NANO PEN NEEDLE) 32 gauge x 5/32" Ndle USE 1 NEEDLE DAILY SUBCUTANEOUSLY 100 each 3  . rosuvastatin (CRESTOR) 5 MG tablet Take 1 tablet (5 mg total) by mouth  once daily 90 tablet 3  . umeclidinium-vilanteroL (ANORO ELLIPTA) 62.5-25 mcg/actuation inhaler Inhale 1 inhalation into the lungs once daily 3 Inhaler 0   No current facility-administered medications for this visit.   Allergies: Patient has no known allergies.  Social and Family History  Social History reports that she has never smoked. She has never used smokeless tobacco. She reports that she does not drink alcohol and does not use drugs.  Family History Family History  Problem Relation Age of Onset  . Diabetes Father  . Heart disease Father  . Cancer Father  . Diabetes type II Father  . Arthritis Father  . Gout Father  . Diabetes Brother  . Arthritis Brother  . Diabetes Brother  . Arthritis Brother  . Diabetes Brother  . Arthritis Brother  . Diabetes Sister  . Breast cancer Sister  2007  . Rheum arthritis Sister  . Arthritis Sister  . Colon cancer Mother  . Arthritis Mother   Review of Systems   Review of Systems: The patient denies chest pain, shortness of breath, orthopnea, paroxysmal nocturnal dyspnea, pedal edema, palpitations, heart racing, fatigue, dizziness, lightheadedness, presyncope, syncope, leg pain, leg cramping. Review of 12 Systems is negative except as described in HPI.   Physical Examination   Vitals:BP 140/84  Pulse 84  Resp 16  Ht 160 cm ('5\' 3"' )  Wt (!) 136.1 kg (300 lb)  SpO2 95%  BMI 53.14 kg/m  Ht:160 cm ('5\' 3"' ) Wt:(!) 136.1 kg (300 lb) UEA:VWUJ surface area is 2.46 meters squared. Body mass index is 53.14 kg/m.  General: Well developed, morbidly obese. In no acute distress HEENT: Pupils equally reactive to light and accomodation  Neck: Supple without thyromegaly, or goiter. Carotid pulses 2+. No carotid bruits present.  Pulmonary: Clear to auscultation bilaterally; no wheezes, rales, rhonchi Cardiovascular: Tachycardic, regular rhythm. No gallops, murmurs or rubs Gastrointestinal: Soft nontender, nondistended, with normal bowel  sounds Extremities: No cyanosis or clubbing. 1+ pitting edema in right lower extremity; 2+ pitting edema in left lower extremity  Peripheral Pulses: 2+ in upper extremities, 2+ in lower extremities  Neurology: Alert and oriented X3 Pysch: Good affect. Responds appropriately  Assessment and Plan   64 y.o. female with  1. Uncontrolled type 2 diabetes mellitus with hyperglycemia (CMS-HCC)  -Continue current medications with routine f/u with endocrinology  2. Morbid obesity (CMS-HCC)  -Weight loss encouraged through dietary modifications  3. Pulmonary hypertension, moderate to severe (CMS-HCC)  -Will evaluate with a clinic sleep study for elevated right sided pressures and moderate TR -Will further evaluate with right heart cath  4. Chronic heart failure with preserved ejection fraction (CMS-HCC)  -BNP, chest xray suggestive of HF exacerbation; encouraged to take metolazone 59m 30 minutes prior to Lasix (increased to 63mfrom 2011m -Stressed the importance of daily weights, low sodium diet, and weight loss  -Instructed to present to the ED if symptoms don't improve     Orders Placed This Encounter  Procedures  . X-ray chest PA and lateral  .  Complete Blood Count (CBC)  . Basic Metabolic Panel (BMP)  . BNP [facility])   Return in about 4 weeks (around 06/04/2019).   Javier Docker Saory Carriero MD  Pt seen and examined. No change from above.

## 2019-05-19 NOTE — Progress Notes (Signed)
Provided patient discharge instructions and discussed post procedure information with interpreter, Karleen Dolphin, present. Have the "interpreter on a stick" available should patient have additional questions or concerns.

## 2019-05-25 DIAGNOSIS — R0600 Dyspnea, unspecified: Secondary | ICD-10-CM | POA: Diagnosis not present

## 2019-05-25 DIAGNOSIS — J810 Acute pulmonary edema: Secondary | ICD-10-CM | POA: Diagnosis not present

## 2019-05-25 DIAGNOSIS — Z01818 Encounter for other preprocedural examination: Secondary | ICD-10-CM | POA: Diagnosis not present

## 2019-05-27 DIAGNOSIS — E1169 Type 2 diabetes mellitus with other specified complication: Secondary | ICD-10-CM | POA: Diagnosis not present

## 2019-05-27 DIAGNOSIS — H9190 Unspecified hearing loss, unspecified ear: Secondary | ICD-10-CM | POA: Diagnosis not present

## 2019-05-27 DIAGNOSIS — E669 Obesity, unspecified: Secondary | ICD-10-CM | POA: Diagnosis not present

## 2019-05-27 DIAGNOSIS — I272 Pulmonary hypertension, unspecified: Secondary | ICD-10-CM | POA: Diagnosis not present

## 2019-05-27 DIAGNOSIS — I5032 Chronic diastolic (congestive) heart failure: Secondary | ICD-10-CM | POA: Diagnosis not present

## 2019-06-18 DIAGNOSIS — H9190 Unspecified hearing loss, unspecified ear: Secondary | ICD-10-CM | POA: Diagnosis not present

## 2019-06-18 DIAGNOSIS — Z79899 Other long term (current) drug therapy: Secondary | ICD-10-CM | POA: Diagnosis not present

## 2019-06-18 DIAGNOSIS — M059 Rheumatoid arthritis with rheumatoid factor, unspecified: Secondary | ICD-10-CM | POA: Diagnosis not present

## 2019-06-18 DIAGNOSIS — I272 Pulmonary hypertension, unspecified: Secondary | ICD-10-CM | POA: Diagnosis not present

## 2019-06-18 DIAGNOSIS — I5032 Chronic diastolic (congestive) heart failure: Secondary | ICD-10-CM | POA: Diagnosis not present

## 2019-06-18 DIAGNOSIS — M159 Polyosteoarthritis, unspecified: Secondary | ICD-10-CM | POA: Diagnosis not present

## 2019-07-07 DIAGNOSIS — Z794 Long term (current) use of insulin: Secondary | ICD-10-CM | POA: Diagnosis not present

## 2019-07-07 DIAGNOSIS — M059 Rheumatoid arthritis with rheumatoid factor, unspecified: Secondary | ICD-10-CM | POA: Diagnosis not present

## 2019-07-07 DIAGNOSIS — E113293 Type 2 diabetes mellitus with mild nonproliferative diabetic retinopathy without macular edema, bilateral: Secondary | ICD-10-CM | POA: Diagnosis not present

## 2019-07-07 DIAGNOSIS — Z79899 Other long term (current) drug therapy: Secondary | ICD-10-CM | POA: Diagnosis not present

## 2019-07-13 DIAGNOSIS — E669 Obesity, unspecified: Secondary | ICD-10-CM | POA: Diagnosis not present

## 2019-07-13 DIAGNOSIS — I10 Essential (primary) hypertension: Secondary | ICD-10-CM | POA: Diagnosis not present

## 2019-07-13 DIAGNOSIS — Z794 Long term (current) use of insulin: Secondary | ICD-10-CM | POA: Diagnosis not present

## 2019-07-13 DIAGNOSIS — N1831 Chronic kidney disease, stage 3a: Secondary | ICD-10-CM | POA: Diagnosis not present

## 2019-07-13 DIAGNOSIS — E1169 Type 2 diabetes mellitus with other specified complication: Secondary | ICD-10-CM | POA: Diagnosis not present

## 2019-07-13 DIAGNOSIS — E1122 Type 2 diabetes mellitus with diabetic chronic kidney disease: Secondary | ICD-10-CM | POA: Diagnosis not present

## 2019-07-13 DIAGNOSIS — E113293 Type 2 diabetes mellitus with mild nonproliferative diabetic retinopathy without macular edema, bilateral: Secondary | ICD-10-CM | POA: Diagnosis not present

## 2019-08-27 DIAGNOSIS — I272 Pulmonary hypertension, unspecified: Secondary | ICD-10-CM | POA: Diagnosis not present

## 2019-08-27 DIAGNOSIS — Z794 Long term (current) use of insulin: Secondary | ICD-10-CM | POA: Diagnosis not present

## 2019-08-27 DIAGNOSIS — I5032 Chronic diastolic (congestive) heart failure: Secondary | ICD-10-CM | POA: Diagnosis not present

## 2019-08-27 DIAGNOSIS — E1140 Type 2 diabetes mellitus with diabetic neuropathy, unspecified: Secondary | ICD-10-CM | POA: Diagnosis not present

## 2019-11-11 DIAGNOSIS — E669 Obesity, unspecified: Secondary | ICD-10-CM | POA: Diagnosis not present

## 2019-11-11 DIAGNOSIS — Z794 Long term (current) use of insulin: Secondary | ICD-10-CM | POA: Diagnosis not present

## 2019-11-11 DIAGNOSIS — E113293 Type 2 diabetes mellitus with mild nonproliferative diabetic retinopathy without macular edema, bilateral: Secondary | ICD-10-CM | POA: Diagnosis not present

## 2019-11-11 DIAGNOSIS — E1169 Type 2 diabetes mellitus with other specified complication: Secondary | ICD-10-CM | POA: Diagnosis not present

## 2019-11-17 DIAGNOSIS — E1122 Type 2 diabetes mellitus with diabetic chronic kidney disease: Secondary | ICD-10-CM | POA: Diagnosis not present

## 2019-11-17 DIAGNOSIS — I10 Essential (primary) hypertension: Secondary | ICD-10-CM | POA: Diagnosis not present

## 2019-11-17 DIAGNOSIS — Z794 Long term (current) use of insulin: Secondary | ICD-10-CM | POA: Diagnosis not present

## 2019-11-17 DIAGNOSIS — E1165 Type 2 diabetes mellitus with hyperglycemia: Secondary | ICD-10-CM | POA: Diagnosis not present

## 2019-11-17 DIAGNOSIS — E669 Obesity, unspecified: Secondary | ICD-10-CM | POA: Diagnosis not present

## 2019-11-17 DIAGNOSIS — E1169 Type 2 diabetes mellitus with other specified complication: Secondary | ICD-10-CM | POA: Diagnosis not present

## 2019-11-17 DIAGNOSIS — N1831 Chronic kidney disease, stage 3a: Secondary | ICD-10-CM | POA: Diagnosis not present

## 2019-11-17 DIAGNOSIS — E113293 Type 2 diabetes mellitus with mild nonproliferative diabetic retinopathy without macular edema, bilateral: Secondary | ICD-10-CM | POA: Diagnosis not present

## 2019-11-19 DIAGNOSIS — Z79899 Other long term (current) drug therapy: Secondary | ICD-10-CM | POA: Diagnosis not present

## 2019-11-19 DIAGNOSIS — I5032 Chronic diastolic (congestive) heart failure: Secondary | ICD-10-CM | POA: Diagnosis not present

## 2019-11-19 DIAGNOSIS — M059 Rheumatoid arthritis with rheumatoid factor, unspecified: Secondary | ICD-10-CM | POA: Diagnosis not present

## 2019-11-25 ENCOUNTER — Other Ambulatory Visit: Payer: Self-pay | Admitting: Family Medicine

## 2019-11-25 DIAGNOSIS — Z1231 Encounter for screening mammogram for malignant neoplasm of breast: Secondary | ICD-10-CM

## 2019-12-01 DIAGNOSIS — Z794 Long term (current) use of insulin: Secondary | ICD-10-CM | POA: Diagnosis not present

## 2019-12-01 DIAGNOSIS — I272 Pulmonary hypertension, unspecified: Secondary | ICD-10-CM | POA: Diagnosis not present

## 2019-12-01 DIAGNOSIS — E113293 Type 2 diabetes mellitus with mild nonproliferative diabetic retinopathy without macular edema, bilateral: Secondary | ICD-10-CM | POA: Diagnosis not present

## 2019-12-01 DIAGNOSIS — I5032 Chronic diastolic (congestive) heart failure: Secondary | ICD-10-CM | POA: Diagnosis not present

## 2019-12-01 DIAGNOSIS — E1140 Type 2 diabetes mellitus with diabetic neuropathy, unspecified: Secondary | ICD-10-CM | POA: Diagnosis not present

## 2019-12-11 ENCOUNTER — Inpatient Hospital Stay: Payer: Medicare HMO

## 2019-12-11 ENCOUNTER — Inpatient Hospital Stay: Payer: Medicare HMO | Attending: Oncology | Admitting: Oncology

## 2019-12-11 ENCOUNTER — Encounter: Payer: Self-pay | Admitting: Oncology

## 2019-12-11 VITALS — BP 133/63 | HR 84 | Temp 97.9°F | Resp 18 | Wt 295.1 lb

## 2019-12-11 DIAGNOSIS — M069 Rheumatoid arthritis, unspecified: Secondary | ICD-10-CM | POA: Insufficient documentation

## 2019-12-11 DIAGNOSIS — N189 Chronic kidney disease, unspecified: Secondary | ICD-10-CM | POA: Diagnosis not present

## 2019-12-11 DIAGNOSIS — Z794 Long term (current) use of insulin: Secondary | ICD-10-CM | POA: Diagnosis not present

## 2019-12-11 DIAGNOSIS — E1122 Type 2 diabetes mellitus with diabetic chronic kidney disease: Secondary | ICD-10-CM

## 2019-12-11 DIAGNOSIS — Z803 Family history of malignant neoplasm of breast: Secondary | ICD-10-CM | POA: Diagnosis not present

## 2019-12-11 DIAGNOSIS — D649 Anemia, unspecified: Secondary | ICD-10-CM

## 2019-12-11 DIAGNOSIS — Z79899 Other long term (current) drug therapy: Secondary | ICD-10-CM | POA: Diagnosis not present

## 2019-12-11 DIAGNOSIS — R609 Edema, unspecified: Secondary | ICD-10-CM | POA: Diagnosis not present

## 2019-12-11 LAB — RETIC PANEL
Immature Retic Fract: 31.9 % — ABNORMAL HIGH (ref 2.3–15.9)
RBC.: 3.44 MIL/uL — ABNORMAL LOW (ref 3.87–5.11)
Retic Count, Absolute: 122.1 10*3/uL (ref 19.0–186.0)
Retic Ct Pct: 3.6 % — ABNORMAL HIGH (ref 0.4–3.1)
Reticulocyte Hemoglobin: 30.3 pg (ref >27.9)

## 2019-12-11 LAB — CBC WITH DIFFERENTIAL/PLATELET
Abs Immature Granulocytes: 0.07 10*3/uL (ref 0.00–0.07)
Basophils Absolute: 0.1 10*3/uL (ref 0.0–0.1)
Basophils Relative: 1 %
Eosinophils Absolute: 0.5 10*3/uL (ref 0.0–0.5)
Eosinophils Relative: 4 %
HCT: 34.9 % — ABNORMAL LOW (ref 36.0–46.0)
Hemoglobin: 10.9 g/dL — ABNORMAL LOW (ref 12.0–15.0)
Immature Granulocytes: 1 %
Lymphocytes Relative: 26 %
Lymphs Abs: 2.8 10*3/uL (ref 0.7–4.0)
MCH: 30.7 pg (ref 26.0–34.0)
MCHC: 31.2 g/dL (ref 30.0–36.0)
MCV: 98.3 fL (ref 80.0–100.0)
Monocytes Absolute: 0.8 10*3/uL (ref 0.1–1.0)
Monocytes Relative: 7 %
Neutro Abs: 6.4 10*3/uL (ref 1.7–7.7)
Neutrophils Relative %: 61 %
Platelets: 295 10*3/uL (ref 150–400)
RBC: 3.55 MIL/uL — ABNORMAL LOW (ref 3.87–5.11)
RDW: 16.1 % — ABNORMAL HIGH (ref 11.5–15.5)
WBC: 10.6 10*3/uL — ABNORMAL HIGH (ref 4.0–10.5)
nRBC: 0 % (ref 0.0–0.2)

## 2019-12-11 LAB — IRON AND TIBC
Iron: 42 ug/dL (ref 28–170)
Saturation Ratios: 9 % — ABNORMAL LOW (ref 10.4–31.8)
TIBC: 475 ug/dL — ABNORMAL HIGH (ref 250–450)
UIBC: 433 ug/dL

## 2019-12-11 LAB — TECHNOLOGIST SMEAR REVIEW: Plt Morphology: NORMAL

## 2019-12-11 LAB — VITAMIN B12: Vitamin B-12: 74 pg/mL — ABNORMAL LOW (ref 180–914)

## 2019-12-11 LAB — FOLATE: Folate: 41 ng/mL (ref >5.9)

## 2019-12-11 LAB — TSH: TSH: 3.373 u[IU]/mL (ref 0.350–4.500)

## 2019-12-11 LAB — FERRITIN: Ferritin: 24 ng/mL (ref 11–307)

## 2019-12-11 NOTE — Progress Notes (Signed)
Hematology/Oncology Consult note [facility] [phone] [phone]   Patient Care Team: [place] Pink, MD as PCP - General (Family Medicine)  REFERRING PROVIDER: Quintin Alto, MD  CHIEF COMPLAINTS/REASON FOR VISIT:  Evaluation of anemia  HISTORY OF PRESENTING ILLNESS:   Alice Hughes is a  64 y.o.  female with PMH listed below was seen in consultation at the request of  Quintin Alto, MD  for evaluation of anemia  Patient has hearing impairment.  She was accompanied by her husband who also has hearing impairment. Online sign language interpreter service was utilized for the entire encounter. 11/19/2019, hemoglobin 9.9, MCV 100.9, normal white count  Reviewed.  History.  Anemia is chronic, since October 2019. She has history of chronic kidney disease-11/11/2019, creatinine 1.3, estimated GFR 41 Rheumatoid arthritis, on methotrexate.  She takes folic acid daily.  Patient denies any unintentional weight loss, fever, night sweats.  She has good appetite.  She takes furosemide for chronic bilateral lower extremity edema.  Diabetes on insulin.  Review of Systems  Constitutional: Negative for appetite change, chills and fever.  HENT:   Negative for hearing loss and voice change.   Eyes: Negative for eye problems.  Respiratory: Negative for chest tightness and cough.   Cardiovascular: Positive for leg swelling. Negative for chest pain.  Gastrointestinal: Negative for abdominal distention, abdominal pain and blood in stool.  Endocrine: Negative for hot flashes.  Genitourinary: Negative for difficulty urinating and frequency.   Musculoskeletal: Negative for arthralgias.  Skin: Negative for itching and rash.  Neurological: Negative for extremity weakness.  Hematological: Negative for adenopathy.  Psychiatric/Behavioral: Negative for confusion.    MEDICAL HISTORY:  Past Medical History:  Diagnosis Date  . Arthritis   . Deaf   .  Diabetes mellitus without complication ([place])   . Gallstones   . Heart failure ([place])   . Hypertension   . Obesity   . Rheumatoid arthritis ([place])   . Transverse myelitis ([place])     SURGICAL HISTORY: Past Surgical History:  Procedure Laterality Date  . ABDOMINAL HYSTERECTOMY    . CHOLECYSTECTOMY    . RIGHT HEART CATH AND CORONARY ANGIOGRAPHY Right 05/19/2019   Procedure: RIGHT HEART CATH;  Surgeon: Teodoro Spray, MD;  Location: [place] [facility];  Service: Cardiovascular;  Laterality: Right;    SOCIAL HISTORY: Social History   Socioeconomic History  . Marital status: Married    Spouse name: Not on file  . Number of children: Not on file  . Years of education: Not on file  . Highest education level: Not on file  Occupational History  . Not on file  Tobacco Use  . Smoking status: Never Smoker  . Smokeless tobacco: Never Used  Vaping Use  . Vaping Use: Never used  Substance and Sexual Activity  . Alcohol use: Not Currently  . Drug use: Never  . Sexual activity: Not on file  Other Topics Concern  . Not on file  Social History Narrative  . Not on file   Social Determinants of Health   Financial Resource Strain: Not on file  Food Insecurity: Not on file  Transportation Needs: Not on file  Physical Activity: Not on file  Stress: Not on file  Social Connections: Not on file  Intimate Partner Violence: Not on file    FAMILY HISTORY: Family History  Problem Relation Age of Onset  . Breast cancer Sister 42  . Diabetes Mother   . Diabetes Father   .  Heart disease Father     ALLERGIES:  has No Known Allergies.  MEDICATIONS:  Current Outpatient Medications  Medication Sig Dispense Refill  . folic acid (FOLVITE) 1 MG tablet Take 1 mg by mouth daily.    . furosemide (LASIX) 20 MG tablet Take 20 mg by mouth daily.    Marland Kitchen gabapentin (NEURONTIN) 300 MG capsule Take 300 mg by mouth in the morning and at bedtime.    Marland Kitchen glimepiride (AMARYL) 4 MG tablet Take 4 mg by mouth  daily with breakfast.    . insulin glargine (LANTUS) 100 UNIT/ML injection Inject 70 Units into the skin at bedtime.     . insulin regular (NOVOLIN R,HUMULIN R) 100 units/mL injection Inject 25-35 Units into the skin See admin instructions. Inject 25 units into the skin in the morning and 35 units in the evening    . lisinopril (ZESTRIL) 20 MG tablet Take 20 mg by mouth daily.     . metFORMIN (GLUCOPHAGE) 1000 MG tablet Take 1,000 mg by mouth 2 (two) times daily with a meal.    . methotrexate (RHEUMATREX) 2.5 MG tablet Take 15 mg by mouth every Friday.    . metolazone (ZAROXOLYN) 5 MG tablet Take 5 mg by mouth daily.    . rosuvastatin (CRESTOR) 5 MG tablet Take 5 mg by mouth at bedtime.     Jearl Klinefelter ELLIPTA 62.5-25 MCG/INH AEPB Inhale 1 puff into the lungs in the morning.  (Patient not taking: Reported on 12/11/2019)    . cyclobenzaprine (FLEXERIL) 5 MG tablet 1 tablet every 8 hours as he did for muscle spasms (Patient not taking: No sig reported) 15 tablet 0  . HYDROcodone-acetaminophen (NORCO) 5-325 MG tablet Take 1 tablet by mouth every 6 (six) hours as needed for moderate pain. (Patient not taking: No sig reported) 15 tablet 0  . ibuprofen (ADVIL,MOTRIN) 800 MG tablet Take 1 tablet (800 mg total) by mouth every 8 (eight) hours as needed for moderate pain. (Patient not taking: No sig reported) 15 tablet 0   No current facility-administered medications for this visit.   Facility-Administered Medications Ordered in Other Visits  Medication Dose Route Frequency Provider Last Rate Last Admin  . sodium chloride flush (NS) 0.9 % injection 3 mL  3 mL Intravenous Q12H Teodoro Spray, MD         PHYSICAL EXAMINATION: ECOG PERFORMANCE STATUS: 2 - Symptomatic, <50% confined to bed Vitals:   12/11/19 1512 12/11/19 1513  BP:  133/63  Pulse:  84  Resp: 18 18  Temp: 97.9 F (36.6 C) 97.9 F (36.6 C)   Filed Weights   12/11/19 1512 12/11/19 1513  Weight: 295 lb 1.6 oz (133.9 kg) 295 lb 1.6 oz  (133.9 kg)    Physical Exam Constitutional:      General: She is not in acute distress. HENT:     Head: Normocephalic and atraumatic.  Eyes:     General: No scleral icterus. Cardiovascular:     Rate and Rhythm: Normal rate and regular rhythm.     Heart sounds: Normal heart sounds.  Pulmonary:     Effort: Pulmonary effort is normal. No respiratory distress.     Breath sounds: No wheezing.  Abdominal:     General: Bowel sounds are normal. There is no distension.     Palpations: Abdomen is soft.  Musculoskeletal:        General: No deformity. Normal range of motion.     Cervical back: Normal range of motion and  neck supple.  Skin:    General: Skin is warm and dry.     Findings: No erythema or rash.  Neurological:     Mental Status: She is alert. Mental status is at baseline.     Coordination: Coordination normal.     Comments: Chronic hearing impairment  Psychiatric:        Mood and Affect: Mood normal.     LABORATORY DATA:  I have reviewed the data as listed Lab Results  Component Value Date   WBC 10.6 (H) 12/11/2019   HGB 10.9 (L) 12/11/2019   HCT 34.9 (L) 12/11/2019   MCV 98.3 12/11/2019   PLT 295 12/11/2019   No results for input(s): NA, K, CL, CO2, GLUCOSE, BUN, CREATININE, CALCIUM, GFRNONAA, GFRAA, PROT, ALBUMIN, AST, ALT, ALKPHOS, BILITOT, BILIDIR, IBILI in the last 8760 hours. Iron/TIBC/Ferritin/ %Sat    Component Value Date/Time   IRON 42 12/11/2019 1549   TIBC 475 (H) 12/11/2019 1549   FERRITIN 24 12/11/2019 1549   IRONPCTSAT 9 (L) 12/11/2019 1549      RADIOGRAPHIC STUDIES: I have personally reviewed the radiological images as listed and agreed with the findings in the report. No results found.    ASSESSMENT & PLAN:  1. Anemia, unspecified type    Anemia: Discussed with patient about the possible causes including chronic blood loss, hyper/hypothyroidism, nutritional deficiency, infection/chronic inflammation, hemolysis, underlying bone marrow  disorders.  Chronic use of methotrexate, kidney disease, potentially may attribute to chronic anemia.  Rule out other etiologies. Will check CBC w differential, CMP, vitamin B12, Folate, iron/TIBC, ferritin, reticulocytes,  blood smear, SPEP   Orders Placed This Encounter  Procedures  . Iron and TIBC    Standing Status:   Future    Number of Occurrences:   1    Standing Expiration Date:   12/01/2020  . Ferritin    Standing Status:   Future    Number of Occurrences:   1    Standing Expiration Date:   12/01/2020  . Folate    Standing Status:   Future    Number of Occurrences:   1    Standing Expiration Date:   12/01/2020  . Vitamin B12    Standing Status:   Future    Number of Occurrences:   1    Standing Expiration Date:   12/01/2020  . TSH    Standing Status:   Future    Number of Occurrences:   1    Standing Expiration Date:   12/01/2020  . Technologist smear review    Standing Status:   Future    Number of Occurrences:   1    Standing Expiration Date:   12/01/2020  . Comprehensive metabolic panel    Standing Status:   Future    Number of Occurrences:   1    Standing Expiration Date:   12/01/2020  . CBC with Differential/Platelet    Standing Status:   Future    Number of Occurrences:   1    Standing Expiration Date:   12/01/2020  . Retic Panel    Standing Status:   Future    Number of Occurrences:   1    Standing Expiration Date:   12/01/2020  . Protein electrophoresis, serum    Standing Status:   Future    Number of Occurrences:   1    Standing Expiration Date:   12/01/2020    All questions were answered. The patient knows to call the clinic  with any problems questions or concerns.  cc Quintin Alto, MD    Return of visit: 2 to 3 weeks to go over blood work. Thank you for this kind referral and the opportunity to participate in the care of this patient. A copy of today's note is routed to referring provider    Earlie Server, MD, PhD Hematology Oncology [facility] Pager- [phone] 12/11/2019

## 2019-12-11 NOTE — Progress Notes (Signed)
Patient here to establish care. Patient is deaf and chart was reviewed using sign language interpreter [phone].

## 2019-12-14 LAB — PROTEIN ELECTROPHORESIS, SERUM
A/G Ratio: 0.9 (ref 0.7–1.7)
Albumin ELP: 3.6 g/dL (ref 2.9–4.4)
Alpha-1-Globulin: 0.3 g/dL (ref 0.0–0.4)
Alpha-2-Globulin: 1.1 g/dL — ABNORMAL HIGH (ref 0.4–1.0)
Beta Globulin: 1.2 g/dL (ref 0.7–1.3)
Gamma Globulin: 1.2 g/dL (ref 0.4–1.8)
Globulin, Total: 3.8 g/dL (ref 2.2–3.9)
Total Protein ELP: 7.4 g/dL (ref 6.0–8.5)

## 2020-01-05 ENCOUNTER — Inpatient Hospital Stay: Payer: Medicare HMO | Attending: Oncology | Admitting: Oncology

## 2020-01-05 ENCOUNTER — Ambulatory Visit: Payer: Medicare HMO | Admitting: Oncology

## 2020-01-06 ENCOUNTER — Telehealth: Payer: Self-pay | Admitting: Oncology

## 2020-01-06 NOTE — Telephone Encounter (Signed)
Alice Hughes, pt did not come to yesteday's appt. Will need to be r/s. Please contact pt.

## 2020-01-07 NOTE — Telephone Encounter (Signed)
FYI.. Still working on trying to get her sched

## 2020-01-08 NOTE — Telephone Encounter (Signed)
FYI... A message was left (on pt VM)  with Interpreter [phone] In reference to her NS 01/05/20 MD visit and to please contact  The office to have her appt R/S

## 2020-02-22 DIAGNOSIS — E669 Obesity, unspecified: Secondary | ICD-10-CM | POA: Diagnosis not present

## 2020-02-22 DIAGNOSIS — I10 Essential (primary) hypertension: Secondary | ICD-10-CM | POA: Diagnosis not present

## 2020-02-22 DIAGNOSIS — Z794 Long term (current) use of insulin: Secondary | ICD-10-CM | POA: Diagnosis not present

## 2020-02-22 DIAGNOSIS — E113293 Type 2 diabetes mellitus with mild nonproliferative diabetic retinopathy without macular edema, bilateral: Secondary | ICD-10-CM | POA: Diagnosis not present

## 2020-02-22 DIAGNOSIS — E1165 Type 2 diabetes mellitus with hyperglycemia: Secondary | ICD-10-CM | POA: Diagnosis not present

## 2020-02-22 DIAGNOSIS — E1122 Type 2 diabetes mellitus with diabetic chronic kidney disease: Secondary | ICD-10-CM | POA: Diagnosis not present

## 2020-02-22 DIAGNOSIS — E1169 Type 2 diabetes mellitus with other specified complication: Secondary | ICD-10-CM | POA: Diagnosis not present

## 2020-02-22 DIAGNOSIS — N1831 Chronic kidney disease, stage 3a: Secondary | ICD-10-CM | POA: Diagnosis not present

## 2020-03-21 DIAGNOSIS — Z862 Personal history of diseases of the blood and blood-forming organs and certain disorders involving the immune mechanism: Secondary | ICD-10-CM | POA: Diagnosis not present

## 2020-03-21 DIAGNOSIS — D649 Anemia, unspecified: Secondary | ICD-10-CM | POA: Diagnosis not present

## 2020-03-21 DIAGNOSIS — M059 Rheumatoid arthritis with rheumatoid factor, unspecified: Secondary | ICD-10-CM | POA: Diagnosis not present

## 2020-03-21 DIAGNOSIS — Z79899 Other long term (current) drug therapy: Secondary | ICD-10-CM | POA: Diagnosis not present

## 2020-03-21 DIAGNOSIS — E1165 Type 2 diabetes mellitus with hyperglycemia: Secondary | ICD-10-CM | POA: Diagnosis not present

## 2020-03-21 DIAGNOSIS — N184 Chronic kidney disease, stage 4 (severe): Secondary | ICD-10-CM | POA: Diagnosis not present

## 2020-03-22 DIAGNOSIS — Z862 Personal history of diseases of the blood and blood-forming organs and certain disorders involving the immune mechanism: Secondary | ICD-10-CM | POA: Diagnosis not present

## 2020-03-22 DIAGNOSIS — K625 Hemorrhage of anus and rectum: Secondary | ICD-10-CM | POA: Diagnosis not present

## 2020-03-22 DIAGNOSIS — Z1211 Encounter for screening for malignant neoplasm of colon: Secondary | ICD-10-CM | POA: Diagnosis not present

## 2020-03-22 DIAGNOSIS — H9193 Unspecified hearing loss, bilateral: Secondary | ICD-10-CM | POA: Diagnosis not present

## 2020-03-22 DIAGNOSIS — D649 Anemia, unspecified: Secondary | ICD-10-CM | POA: Diagnosis not present

## 2020-03-22 DIAGNOSIS — E538 Deficiency of other specified B group vitamins: Secondary | ICD-10-CM | POA: Diagnosis not present

## 2020-03-22 DIAGNOSIS — R197 Diarrhea, unspecified: Secondary | ICD-10-CM | POA: Diagnosis not present

## 2020-05-16 DIAGNOSIS — E113293 Type 2 diabetes mellitus with mild nonproliferative diabetic retinopathy without macular edema, bilateral: Secondary | ICD-10-CM | POA: Diagnosis not present

## 2020-05-16 DIAGNOSIS — Z794 Long term (current) use of insulin: Secondary | ICD-10-CM | POA: Diagnosis not present

## 2020-05-23 DIAGNOSIS — E1169 Type 2 diabetes mellitus with other specified complication: Secondary | ICD-10-CM | POA: Diagnosis not present

## 2020-05-23 DIAGNOSIS — E1122 Type 2 diabetes mellitus with diabetic chronic kidney disease: Secondary | ICD-10-CM | POA: Diagnosis not present

## 2020-05-23 DIAGNOSIS — E669 Obesity, unspecified: Secondary | ICD-10-CM | POA: Diagnosis not present

## 2020-05-23 DIAGNOSIS — Z794 Long term (current) use of insulin: Secondary | ICD-10-CM | POA: Diagnosis not present

## 2020-05-23 DIAGNOSIS — E113293 Type 2 diabetes mellitus with mild nonproliferative diabetic retinopathy without macular edema, bilateral: Secondary | ICD-10-CM | POA: Diagnosis not present

## 2020-05-23 DIAGNOSIS — N1832 Chronic kidney disease, stage 3b: Secondary | ICD-10-CM | POA: Diagnosis not present

## 2020-05-23 DIAGNOSIS — I10 Essential (primary) hypertension: Secondary | ICD-10-CM | POA: Diagnosis not present

## 2020-05-31 DIAGNOSIS — E1169 Type 2 diabetes mellitus with other specified complication: Secondary | ICD-10-CM | POA: Diagnosis not present

## 2020-05-31 DIAGNOSIS — E669 Obesity, unspecified: Secondary | ICD-10-CM | POA: Diagnosis not present

## 2020-05-31 DIAGNOSIS — I5032 Chronic diastolic (congestive) heart failure: Secondary | ICD-10-CM | POA: Diagnosis not present

## 2020-06-01 ENCOUNTER — Ambulatory Visit: Admission: RE | Admit: 2020-06-01 | Payer: Medicare HMO | Source: Home / Self Care | Admitting: Internal Medicine

## 2020-06-01 ENCOUNTER — Encounter: Admission: RE | Payer: Self-pay | Source: Home / Self Care

## 2020-06-01 SURGERY — COLONOSCOPY WITH PROPOFOL
Anesthesia: General

## 2020-08-19 DIAGNOSIS — E1122 Type 2 diabetes mellitus with diabetic chronic kidney disease: Secondary | ICD-10-CM | POA: Diagnosis not present

## 2020-08-19 DIAGNOSIS — N1832 Chronic kidney disease, stage 3b: Secondary | ICD-10-CM | POA: Diagnosis not present

## 2020-08-19 DIAGNOSIS — Z794 Long term (current) use of insulin: Secondary | ICD-10-CM | POA: Diagnosis not present

## 2020-08-23 DIAGNOSIS — N1832 Chronic kidney disease, stage 3b: Secondary | ICD-10-CM | POA: Diagnosis not present

## 2020-08-23 DIAGNOSIS — E1169 Type 2 diabetes mellitus with other specified complication: Secondary | ICD-10-CM | POA: Diagnosis not present

## 2020-08-23 DIAGNOSIS — I10 Essential (primary) hypertension: Secondary | ICD-10-CM | POA: Diagnosis not present

## 2020-08-23 DIAGNOSIS — E113293 Type 2 diabetes mellitus with mild nonproliferative diabetic retinopathy without macular edema, bilateral: Secondary | ICD-10-CM | POA: Diagnosis not present

## 2020-08-23 DIAGNOSIS — E1122 Type 2 diabetes mellitus with diabetic chronic kidney disease: Secondary | ICD-10-CM | POA: Diagnosis not present

## 2020-08-23 DIAGNOSIS — Z794 Long term (current) use of insulin: Secondary | ICD-10-CM | POA: Diagnosis not present

## 2020-08-23 DIAGNOSIS — E669 Obesity, unspecified: Secondary | ICD-10-CM | POA: Diagnosis not present

## 2020-09-13 DIAGNOSIS — M059 Rheumatoid arthritis with rheumatoid factor, unspecified: Secondary | ICD-10-CM | POA: Diagnosis not present

## 2020-09-13 DIAGNOSIS — Z79899 Other long term (current) drug therapy: Secondary | ICD-10-CM | POA: Diagnosis not present

## 2020-11-29 DIAGNOSIS — E1122 Type 2 diabetes mellitus with diabetic chronic kidney disease: Secondary | ICD-10-CM | POA: Diagnosis not present

## 2020-11-29 DIAGNOSIS — Z794 Long term (current) use of insulin: Secondary | ICD-10-CM | POA: Diagnosis not present

## 2020-11-29 DIAGNOSIS — N1832 Chronic kidney disease, stage 3b: Secondary | ICD-10-CM | POA: Diagnosis not present

## 2020-12-05 DIAGNOSIS — I10 Essential (primary) hypertension: Secondary | ICD-10-CM | POA: Diagnosis not present

## 2020-12-05 DIAGNOSIS — E1122 Type 2 diabetes mellitus with diabetic chronic kidney disease: Secondary | ICD-10-CM | POA: Diagnosis not present

## 2020-12-05 DIAGNOSIS — N1832 Chronic kidney disease, stage 3b: Secondary | ICD-10-CM | POA: Diagnosis not present

## 2020-12-05 DIAGNOSIS — E113293 Type 2 diabetes mellitus with mild nonproliferative diabetic retinopathy without macular edema, bilateral: Secondary | ICD-10-CM | POA: Diagnosis not present

## 2020-12-05 DIAGNOSIS — E1169 Type 2 diabetes mellitus with other specified complication: Secondary | ICD-10-CM | POA: Diagnosis not present

## 2020-12-05 DIAGNOSIS — Z794 Long term (current) use of insulin: Secondary | ICD-10-CM | POA: Diagnosis not present

## 2020-12-05 DIAGNOSIS — E669 Obesity, unspecified: Secondary | ICD-10-CM | POA: Diagnosis not present

## 2020-12-06 DIAGNOSIS — I5030 Unspecified diastolic (congestive) heart failure: Secondary | ICD-10-CM | POA: Diagnosis not present

## 2020-12-06 DIAGNOSIS — I272 Pulmonary hypertension, unspecified: Secondary | ICD-10-CM | POA: Diagnosis not present

## 2020-12-14 ENCOUNTER — Other Ambulatory Visit: Payer: Self-pay | Admitting: Family Medicine

## 2020-12-14 DIAGNOSIS — Z1231 Encounter for screening mammogram for malignant neoplasm of breast: Secondary | ICD-10-CM

## 2021-01-05 ENCOUNTER — Other Ambulatory Visit: Payer: Self-pay

## 2021-01-05 ENCOUNTER — Ambulatory Visit
Admission: RE | Admit: 2021-01-05 | Discharge: 2021-01-05 | Disposition: A | Discharge status: Home / Self Care | Payer: 59 | Source: Ambulatory Visit | Attending: Family Medicine | Admitting: Family Medicine

## 2021-01-05 DIAGNOSIS — Z1231 Encounter for screening mammogram for malignant neoplasm of breast: Secondary | ICD-10-CM | POA: Diagnosis not present

## 2021-01-15 DIAGNOSIS — E785 Hyperlipidemia, unspecified: Secondary | ICD-10-CM | POA: Insufficient documentation

## 2021-01-15 DIAGNOSIS — I890 Lymphedema, not elsewhere classified: Secondary | ICD-10-CM | POA: Insufficient documentation

## 2021-01-15 DIAGNOSIS — I872 Venous insufficiency (chronic) (peripheral): Secondary | ICD-10-CM | POA: Insufficient documentation

## 2021-01-15 DIAGNOSIS — I10 Essential (primary) hypertension: Secondary | ICD-10-CM | POA: Insufficient documentation

## 2021-01-15 DIAGNOSIS — E119 Type 2 diabetes mellitus without complications: Secondary | ICD-10-CM | POA: Insufficient documentation

## 2021-01-15 NOTE — Progress Notes (Deleted)
MRN : [id number]  Alice Hughes is a 66 y.o. ([birth year]) female who presents with chief complaint of leg swelling.  History of Present Illness:   Patient is seen for evaluation of leg pain and leg swelling. The patient first noticed the swelling remotely. The swelling is associated with pain and discoloration. The pain and swelling worsens with prolonged dependency and improves with elevation. The pain is unrelated to activity.  The patient notes that in the morning the legs are significantly improved but they steadily worsened throughout the course of the day. The patient also notes a steady worsening of the discoloration in the ankle and shin area.   The patient denies claudication symptoms.  The patient denies symptoms consistent with rest pain.  The patient denies and extensive history of DJD and LS spine disease.  The patient has no had any past angiography, interventions or vascular surgery.  Elevation makes the leg symptoms better, dependency makes them much worse. There is no history of ulcerations. The patient denies any recent changes in medications.  The patient has not been wearing graduated compression.  The patient denies a history of DVT or PE. There is no prior history of phlebitis. There is no history of primary lymphedema.  No history of malignancies. No history of trauma or groin or pelvic surgery. There is no history of radiation treatment to the groin or pelvis  The patient denies amaurosis fugax or recent TIA symptoms. There are no recent neurological changes noted. The patient denies recent episodes of angina or shortness of breath   No outpatient medications have been marked as taking for the 01/16/21 encounter (Appointment) with Delana Meyer, Dolores Lory, MD.    Past Medical History:  Diagnosis Date   Arthritis    Deaf    Diabetes mellitus without complication ([place])    Gallstones    Heart failure ([place])    Hypertension    Obesity    Rheumatoid arthritis ([place])     Transverse myelitis ([place])     Past Surgical History:  Procedure Laterality Date   ABDOMINAL HYSTERECTOMY     CHOLECYSTECTOMY     RIGHT HEART CATH AND CORONARY ANGIOGRAPHY Right 05/19/2019   Procedure: RIGHT HEART CATH;  Surgeon: Teodoro Spray, MD;  Location: [place] [facility];  Service: Cardiovascular;  Laterality: Right;    Social History Social History   Tobacco Use   Smoking status: Never   Smokeless tobacco: Never  Vaping Use   Vaping Use: Never used  Substance Use Topics   Alcohol use: Not Currently   Drug use: Never    Family History Family History  Problem Relation Age of Onset   Breast cancer Sister 26   Diabetes Mother    Diabetes Father    Heart disease Father     No Known Allergies   REVIEW OF SYSTEMS (Negative unless checked)  Constitutional: [] Weight loss  [] Fever  [] Chills Cardiac: [] Chest pain   [] Chest pressure   [] Palpitations   [] Shortness of breath when laying flat   [] Shortness of breath with exertion. Vascular:  [] Pain in legs with walking   [] Pain in legs at rest  [] History of DVT   [] Phlebitis   [x] Swelling in legs   [] Varicose veins   [] Non-healing ulcers Pulmonary:   [] Uses home oxygen   [] Productive cough   [] Hemoptysis   [] Wheeze  [] COPD   [] Asthma Neurologic:  [] Dizziness   [] Seizures   [] History of stroke   [] History of TIA  [] Aphasia   []   Vissual changes   [] Weakness or numbness in arm   [] Weakness or numbness in leg Musculoskeletal:   [] Joint swelling   [] Joint pain   [] Low back pain Hematologic:  [] Easy bruising  [] Easy bleeding   [] Hypercoagulable state   [] Anemic Gastrointestinal:  [] Diarrhea   [] Vomiting  [] Gastroesophageal reflux/heartburn   [] Difficulty swallowing. Genitourinary:  [] Chronic kidney disease   [] Difficult urination  [] Frequent urination   [] Blood in urine Skin:  [] Rashes   [] Ulcers  Psychological:  [] History of anxiety   []  History of major depression.  Physical Examination  There were no vitals filed for  this visit. There is no height or weight on file to calculate BMI. Gen: WD/WN, NAD Head: [place]/AT, No temporalis wasting.  Ear/Nose/Throat: Hearing grossly intact, nares w/o erythema or drainage, pinna without lesions Eyes: PER, EOMI, sclera nonicteric.  Neck: Supple, no gross masses.  No JVD.  Pulmonary:  Good air movement, no audible wheezing, no use of accessory muscles.  Cardiac: RRR, precordium not hyperdynamic. Vascular:  scattered varicosities present bilaterally.  Mild venous stasis changes to the legs bilaterally.  2+ soft pitting edema  Vessel Right Left  Radial Palpable Palpable  Gastrointestinal: soft, non-distended. No guarding/no peritoneal signs.  Musculoskeletal: M/S 5/5 throughout.  No deformity.  Neurologic: CN 2-12 intact. Pain and light touch intact in extremities.  Symmetrical.  Speech is fluent. Motor exam as listed above. Psychiatric: Judgment intact, Mood & affect appropriate for pt's clinical situation. Dermatologic: Venous rashes no ulcers noted.  No changes consistent with cellulitis. Lymph : No lichenification or skin changes of chronic lymphedema.  CBC Lab Results  Component Value Date   WBC 10.6 (H) 12/11/2019   HGB 10.9 (L) 12/11/2019   HCT 34.9 (L) 12/11/2019   MCV 98.3 12/11/2019   PLT 295 12/11/2019    BMET    Component Value Date/Time   NA 138 09/27/2017 1444   K 4.6 09/27/2017 1444   CL 102 09/27/2017 1444   CO2 24 09/27/2017 1444   GLUCOSE 284 (H) 09/27/2017 1444   BUN 15 09/27/2017 1444   CREATININE 0.94 09/27/2017 1444   CALCIUM 9.0 09/27/2017 1444   GFRNONAA >60 09/27/2017 1444   GFRAA >60 09/27/2017 1444   CrCl cannot be calculated (Patient's most recent lab result is older than the maximum 21 days allowed.).  COAG No results found for: INR, PROTIME  Radiology MM 3D SCREEN BREAST BILATERAL  Result Date: 01/05/2021 CLINICAL DATA:  Screening. EXAM: DIGITAL SCREENING BILATERAL MAMMOGRAM WITH TOMOSYNTHESIS AND CAD TECHNIQUE:  Bilateral screening digital craniocaudal and mediolateral oblique mammograms were obtained. Bilateral screening digital breast tomosynthesis was performed. The images were evaluated with computer-aided detection. COMPARISON:  Previous exam(s). ACR Breast Density Category b: There are scattered areas of fibroglandular density. FINDINGS: There are no findings suspicious for malignancy. IMPRESSION: No mammographic evidence of malignancy. A result letter of this screening mammogram will be mailed directly to the patient. RECOMMENDATION: Screening mammogram in one year. (Code:SM-B-01Y) BI-RADS CATEGORY  1: Negative. Electronically Signed   By: Audie Pinto M.D.   On: 01/05/2021 15:49    Assessment/Plan 1. Lymphedema ***  2. Chronic venous insufficiency ***  3. Essential hypertension ***  4. Mixed hyperlipidemia ***  5. Type 2 diabetes mellitus with other circulatory complication, unspecified whether long term insulin use (HCC) ***    Alice Pilar, MD  01/15/2021 1:38 PM

## 2021-01-16 ENCOUNTER — Encounter (INDEPENDENT_AMBULATORY_CARE_PROVIDER_SITE_OTHER): Payer: Self-pay | Admitting: Vascular Surgery

## 2021-01-16 DIAGNOSIS — I10 Essential (primary) hypertension: Secondary | ICD-10-CM

## 2021-01-16 DIAGNOSIS — E1159 Type 2 diabetes mellitus with other circulatory complications: Secondary | ICD-10-CM

## 2021-01-16 DIAGNOSIS — I890 Lymphedema, not elsewhere classified: Secondary | ICD-10-CM

## 2021-01-16 DIAGNOSIS — I872 Venous insufficiency (chronic) (peripheral): Secondary | ICD-10-CM

## 2021-01-16 DIAGNOSIS — E782 Mixed hyperlipidemia: Secondary | ICD-10-CM

## 2021-02-07 DIAGNOSIS — H9190 Unspecified hearing loss, unspecified ear: Secondary | ICD-10-CM | POA: Diagnosis not present

## 2021-02-07 DIAGNOSIS — Z1211 Encounter for screening for malignant neoplasm of colon: Secondary | ICD-10-CM | POA: Diagnosis not present

## 2021-02-18 LAB — COLOGUARD: COLOGUARD: NEGATIVE

## 2021-02-22 LAB — COMPREHENSIVE METABOLIC PANEL

## 2021-03-06 ENCOUNTER — Ambulatory Visit (INDEPENDENT_AMBULATORY_CARE_PROVIDER_SITE_OTHER): Payer: Medicare HMO | Admitting: Vascular Surgery

## 2021-03-06 ENCOUNTER — Other Ambulatory Visit: Payer: Self-pay

## 2021-03-06 DIAGNOSIS — E782 Mixed hyperlipidemia: Secondary | ICD-10-CM

## 2021-03-06 DIAGNOSIS — I10 Essential (primary) hypertension: Secondary | ICD-10-CM

## 2021-03-06 DIAGNOSIS — I890 Lymphedema, not elsewhere classified: Secondary | ICD-10-CM

## 2021-03-06 DIAGNOSIS — I872 Venous insufficiency (chronic) (peripheral): Secondary | ICD-10-CM

## 2021-03-06 NOTE — Progress Notes (Deleted)
? ? ?MRN : [id number] ? ?Alice Hughes is a 66 y.o. (1955-12-21) female who presents with chief complaint of leg swelling. ? ?History of Present Illness:  ? ?Patient is seen for evaluation of leg swelling. The patient first noticed the swelling remotely but is now concerned because of a significant increase in the overall edema. The swelling is associated with pain and discoloration. The patient notes that in the morning the legs are significantly improved but they steadily worsened throughout the course of the day. Elevation makes the legs better, dependency makes them much worse.  ? ?There is no history of ulcerations associated with the swelling.  ? ?The patient denies any recent changes in their medications. ? ?The patient has not been wearing graduated compression. ? ?The patient has no had any past angiography, interventions or vascular surgery. ? ?The patient denies a history of DVT or PE. There is no prior history of phlebitis. ?There is no history of primary lymphedema. ? ?There is no history of radiation treatment to the groin or pelvis ?No history of malignancies. ?No history of trauma or groin or pelvic surgery. ?No history of foreign travel or parasitic infections area   ? ?No outpatient medications have been marked as taking for the 03/06/21 encounter (Appointment) with Delana Meyer, Dolores Lory, MD.  ? ? ?Past Medical History:  ?Diagnosis Date  ? Arthritis   ? Deaf   ? Diabetes mellitus without complication ([place])   ? Gallstones   ? Heart failure ([place])   ? Hypertension   ? Obesity   ? Rheumatoid arthritis ([place])   ? Transverse myelitis ([place])   ? ? ?Past Surgical History:  ?Procedure Laterality Date  ? ABDOMINAL HYSTERECTOMY    ? CHOLECYSTECTOMY    ? RIGHT HEART CATH AND CORONARY ANGIOGRAPHY Right 05/19/2019  ? Procedure: RIGHT HEART CATH;  Surgeon: Teodoro Spray, MD;  Location: [place] [facility];  Service: Cardiovascular;  Laterality: Right;  ? ? ?Social History ?Social History  ? ?Tobacco Use  ? Smoking  status: Never  ? Smokeless tobacco: Never  ?Vaping Use  ? Vaping Use: Never used  ?Substance Use Topics  ? Alcohol use: Not Currently  ? Drug use: Never  ? ? ?Family History ?Family History  ?Problem Relation Age of Onset  ? Breast cancer Sister 39  ? Diabetes Mother   ? Diabetes Father   ? Heart disease Father   ? ? ?No Known Allergies ? ? ?REVIEW OF SYSTEMS (Negative unless checked) ? ?Constitutional: '[]'$ Weight loss  '[]'$ Fever  '[]'$ Chills ?Cardiac: '[]'$ Chest pain   '[]'$ Chest pressure   '[]'$ Palpitations   '[]'$ Shortness of breath when laying flat   '[]'$ Shortness of breath with exertion. ?Vascular:  '[]'$ Pain in legs with walking   '[]'$ Pain in legs at rest  '[]'$ History of DVT   '[]'$ Phlebitis   '[x]'$ Swelling in legs   '[]'$ Varicose veins   '[]'$ Non-healing ulcers ?Pulmonary:   '[]'$ Uses home oxygen   '[]'$ Productive cough   '[]'$ Hemoptysis   '[]'$ Wheeze  '[]'$ COPD   '[]'$ Asthma ?Neurologic:  '[]'$ Dizziness   '[]'$ Seizures   '[]'$ History of stroke   '[]'$ History of TIA  '[]'$ Aphasia   '[]'$ Vissual changes   '[]'$ Weakness or numbness in arm   '[]'$ Weakness or numbness in leg ?Musculoskeletal:   '[]'$ Joint swelling   '[]'$ Joint pain   '[]'$ Low back pain ?Hematologic:  '[]'$ Easy bruising  '[]'$ Easy bleeding   '[]'$ Hypercoagulable state   '[]'$ Anemic ?Gastrointestinal:  '[]'$ Diarrhea   '[]'$ Vomiting  '[]'$ Gastroesophageal reflux/heartburn   '[]'$ Difficulty swallowing. ?Genitourinary:  '[]'$ Chronic kidney disease   '[]'$ Difficult  urination  '[]'$ Frequent urination   '[]'$ Blood in urine ?Skin:  '[]'$ Rashes   '[]'$ Ulcers  ?Psychological:  '[]'$ History of anxiety   '[]'$  History of major depression. ? ?Physical Examination ? ?There were no vitals filed for this visit. ?There is no height or weight on file to calculate BMI. ?Gen: WD/WN, NAD ?Head: [place]/AT, No temporalis wasting.  ?Ear/Nose/Throat: Hearing grossly intact, nares w/o erythema or drainage, pinna without lesions ?Eyes: PER, EOMI, sclera nonicteric.  ?Neck: Supple, no gross masses.  No JVD.  ?Pulmonary:  Good air movement, no audible wheezing, no use of accessory muscles.  ?Cardiac: RRR,  precordium not hyperdynamic. ?Vascular:  scattered varicosities present bilaterally.  Mild venous stasis changes to the legs bilaterally.  2+ soft pitting edema  ?Vessel Right Left  ?Radial Palpable Palpable  ?Gastrointestinal: soft, non-distended. No guarding/no peritoneal signs.  ?Musculoskeletal: M/S 5/5 throughout.  No deformity.  ?Neurologic: CN 2-12 intact. Pain and light touch intact in extremities.  Symmetrical.  Speech is fluent. Motor exam as listed above. ?Psychiatric: Judgment intact, Mood & affect appropriate for pt's clinical situation. ?Dermatologic: Venous rashes no ulcers noted.  No changes consistent with cellulitis. ?Lymph : No lichenification or skin changes of chronic lymphedema. ? ?CBC ?Lab Results  ?Component Value Date  ? WBC 10.6 (H) 12/11/2019  ? HGB 10.9 (L) 12/11/2019  ? HCT 34.9 (L) 12/11/2019  ? MCV 98.3 12/11/2019  ? PLT 295 12/11/2019  ? ? ?BMET ?   ?Component Value Date/Time  ? NA PENDING 12/11/2019 1549  ? K PENDING 12/11/2019 1549  ? CL PENDING 12/11/2019 1549  ? CO2 PENDING 12/11/2019 1549  ? GLUCOSE PENDING 12/11/2019 1549  ? BUN PENDING 12/11/2019 1549  ? CREATININE PENDING 12/11/2019 1549  ? CALCIUM PENDING 12/11/2019 1549  ? [facility] PENDING 12/11/2019 1549  ? GFRAA >60 09/27/2017 1444  ? ?CrCl cannot be calculated (Patient's most recent lab result is older than the maximum 21 days allowed.). ? ?COAG ?No results found for: INR, PROTIME ? ?Radiology ?No results found. ? ? ?Assessment/Plan ?There are no diagnoses linked to this encounter. ? ? ?Hortencia Pilar, MD ? ?03/06/2021 ?3:02 PM ? ?  ?

## 2021-03-08 DIAGNOSIS — E113293 Type 2 diabetes mellitus with mild nonproliferative diabetic retinopathy without macular edema, bilateral: Secondary | ICD-10-CM | POA: Diagnosis not present

## 2021-03-08 DIAGNOSIS — N1832 Chronic kidney disease, stage 3b: Secondary | ICD-10-CM | POA: Diagnosis not present

## 2021-03-08 DIAGNOSIS — E1122 Type 2 diabetes mellitus with diabetic chronic kidney disease: Secondary | ICD-10-CM | POA: Diagnosis not present

## 2021-03-08 DIAGNOSIS — I10 Essential (primary) hypertension: Secondary | ICD-10-CM | POA: Diagnosis not present

## 2021-03-08 DIAGNOSIS — E669 Obesity, unspecified: Secondary | ICD-10-CM | POA: Diagnosis not present

## 2021-03-08 DIAGNOSIS — E1169 Type 2 diabetes mellitus with other specified complication: Secondary | ICD-10-CM | POA: Diagnosis not present

## 2021-03-08 DIAGNOSIS — Z794 Long term (current) use of insulin: Secondary | ICD-10-CM | POA: Diagnosis not present

## 2021-03-10 ENCOUNTER — Encounter (INDEPENDENT_AMBULATORY_CARE_PROVIDER_SITE_OTHER): Payer: Self-pay | Admitting: Vascular Surgery

## 2021-03-10 NOTE — Progress Notes (Signed)
No show

## 2021-03-13 ENCOUNTER — Emergency Department
Admission: EM | Admit: 2021-03-13 | Discharge: 2021-03-13 | Disposition: A | Discharge status: Home / Self Care | Payer: 59 | Attending: Emergency Medicine | Admitting: Emergency Medicine

## 2021-03-13 ENCOUNTER — Emergency Department: Payer: 59

## 2021-03-13 ENCOUNTER — Encounter (INDEPENDENT_AMBULATORY_CARE_PROVIDER_SITE_OTHER): Payer: Self-pay | Admitting: Vascular Surgery

## 2021-03-13 ENCOUNTER — Ambulatory Visit (INDEPENDENT_AMBULATORY_CARE_PROVIDER_SITE_OTHER): Payer: 59 | Admitting: Vascular Surgery

## 2021-03-13 ENCOUNTER — Other Ambulatory Visit: Payer: Self-pay

## 2021-03-13 VITALS — BP 122/74 | HR 76 | Resp 16

## 2021-03-13 DIAGNOSIS — K5792 Diverticulitis of intestine, part unspecified, without perforation or abscess without bleeding: Secondary | ICD-10-CM | POA: Diagnosis not present

## 2021-03-13 DIAGNOSIS — K449 Diaphragmatic hernia without obstruction or gangrene: Secondary | ICD-10-CM | POA: Diagnosis not present

## 2021-03-13 DIAGNOSIS — I890 Lymphedema, not elsewhere classified: Secondary | ICD-10-CM | POA: Diagnosis not present

## 2021-03-13 DIAGNOSIS — D72829 Elevated white blood cell count, unspecified: Secondary | ICD-10-CM | POA: Diagnosis not present

## 2021-03-13 DIAGNOSIS — I872 Venous insufficiency (chronic) (peripheral): Secondary | ICD-10-CM | POA: Diagnosis not present

## 2021-03-13 DIAGNOSIS — K5732 Diverticulitis of large intestine without perforation or abscess without bleeding: Secondary | ICD-10-CM | POA: Diagnosis not present

## 2021-03-13 DIAGNOSIS — R1111 Vomiting without nausea: Secondary | ICD-10-CM | POA: Diagnosis not present

## 2021-03-13 DIAGNOSIS — I700 Atherosclerosis of aorta: Secondary | ICD-10-CM | POA: Diagnosis not present

## 2021-03-13 DIAGNOSIS — K439 Ventral hernia without obstruction or gangrene: Secondary | ICD-10-CM | POA: Diagnosis not present

## 2021-03-13 DIAGNOSIS — E782 Mixed hyperlipidemia: Secondary | ICD-10-CM

## 2021-03-13 DIAGNOSIS — E119 Type 2 diabetes mellitus without complications: Secondary | ICD-10-CM | POA: Diagnosis not present

## 2021-03-13 DIAGNOSIS — R109 Unspecified abdominal pain: Secondary | ICD-10-CM | POA: Diagnosis not present

## 2021-03-13 DIAGNOSIS — R000 Tachycardia, unspecified: Secondary | ICD-10-CM | POA: Diagnosis not present

## 2021-03-13 DIAGNOSIS — R1084 Generalized abdominal pain: Secondary | ICD-10-CM | POA: Diagnosis not present

## 2021-03-13 DIAGNOSIS — R0902 Hypoxemia: Secondary | ICD-10-CM | POA: Diagnosis not present

## 2021-03-13 DIAGNOSIS — I10 Essential (primary) hypertension: Secondary | ICD-10-CM | POA: Insufficient documentation

## 2021-03-13 DIAGNOSIS — R1032 Left lower quadrant pain: Secondary | ICD-10-CM | POA: Diagnosis present

## 2021-03-13 DIAGNOSIS — E1159 Type 2 diabetes mellitus with other circulatory complications: Secondary | ICD-10-CM | POA: Diagnosis not present

## 2021-03-13 LAB — COMPREHENSIVE METABOLIC PANEL
ALT: 28 U/L (ref 0–44)
AST: 36 U/L (ref 15–41)
Albumin: 3.7 g/dL (ref 3.5–5.0)
Alkaline Phosphatase: 48 U/L (ref 38–126)
Anion gap: 8 (ref 5–15)
BUN: 20 mg/dL (ref 8–23)
CO2: 26 mmol/L (ref 22–32)
Calcium: 8.9 mg/dL (ref 8.9–10.3)
Chloride: 103 mmol/L (ref 98–111)
Creatinine, Ser: 1.21 mg/dL — ABNORMAL HIGH (ref 0.44–1.00)
GFR, Estimated: 49 mL/min — ABNORMAL LOW (ref >60)
Glucose, Bld: 230 mg/dL — ABNORMAL HIGH (ref 70–99)
Potassium: 4.7 mmol/L (ref 3.5–5.1)
Sodium: 137 mmol/L (ref 135–145)
Total Bilirubin: 0.9 mg/dL (ref 0.3–1.2)
Total Protein: 8 g/dL (ref 6.5–8.1)

## 2021-03-13 LAB — CBC
HCT: 34.7 % — ABNORMAL LOW (ref 36.0–46.0)
Hemoglobin: 10.5 g/dL — ABNORMAL LOW (ref 12.0–15.0)
MCH: 28.2 pg (ref 26.0–34.0)
MCHC: 30.3 g/dL (ref 30.0–36.0)
MCV: 93.3 fL (ref 80.0–100.0)
Platelets: 286 10*3/uL (ref 150–400)
RBC: 3.72 MIL/uL — ABNORMAL LOW (ref 3.87–5.11)
RDW: 17.1 % — ABNORMAL HIGH (ref 11.5–15.5)
WBC: 10.8 10*3/uL — ABNORMAL HIGH (ref 4.0–10.5)
nRBC: 0 % (ref 0.0–0.2)

## 2021-03-13 LAB — LIPASE, BLOOD: Lipase: 51 U/L (ref 11–51)

## 2021-03-13 MED ORDER — MORPHINE SULFATE (PF) 4 MG/ML IV SOLN
4.0000 mg | Freq: Once | INTRAVENOUS | Status: AC
  Administered 2021-03-13: 4 mg via INTRAVENOUS
  Filled 2021-03-13: qty 1

## 2021-03-13 MED ORDER — AMOXICILLIN-POT CLAVULANATE 875-125 MG PO TABS
1.0000 | ORAL_TABLET | Freq: Once | ORAL | Status: AC
Start: 2021-03-13 — End: 2021-03-13
  Administered 2021-03-13: 1 via ORAL
  Filled 2021-03-13: qty 1

## 2021-03-13 MED ORDER — OXYCODONE-ACETAMINOPHEN 5-325 MG PO TABS
1.0000 | ORAL_TABLET | ORAL | 0 refills | Status: DC | PRN
Start: 2021-03-13 — End: 2023-06-24

## 2021-03-13 MED ORDER — IOHEXOL 300 MG/ML  SOLN: 100.0000 mL | Freq: Once | INTRAMUSCULAR | Status: DC | PRN

## 2021-03-13 MED ORDER — FENTANYL CITRATE PF 50 MCG/ML IJ SOSY
50.0000 ug | PREFILLED_SYRINGE | Freq: Once | INTRAMUSCULAR | Status: AC
  Administered 2021-03-13: 50 ug via INTRAVENOUS
  Filled 2021-03-13: qty 1

## 2021-03-13 MED ORDER — SODIUM CHLORIDE 0.9 % IV BOLUS
1000.0000 mL | Freq: Once | INTRAVENOUS | Status: AC
Start: 2021-03-13 — End: 2021-03-13
  Administered 2021-03-13: 1000 mL via INTRAVENOUS

## 2021-03-13 MED ORDER — AMOXICILLIN-POT CLAVULANATE 875-125 MG PO TABS: 1.0000 | ORAL_TABLET | Freq: Two times a day (BID) | ORAL | 0 refills | Status: AC

## 2021-03-13 MED ORDER — DOCUSATE SODIUM 100 MG PO CAPS: 100.0000 mg | ORAL_CAPSULE | Freq: Two times a day (BID) | ORAL | 0 refills | Status: AC

## 2021-03-13 MED ORDER — ONDANSETRON HCL 4 MG/2ML IJ SOLN
4.0000 mg | Freq: Once | INTRAMUSCULAR | Status: AC
  Administered 2021-03-13: 4 mg via INTRAVENOUS
  Filled 2021-03-13: qty 2

## 2021-03-13 MED ORDER — ONDANSETRON 4 MG PO TBDP: 4.0000 mg | ORAL_TABLET | Freq: Three times a day (TID) | ORAL | 0 refills | Status: DC | PRN

## 2021-03-13 NOTE — ED Provider Notes (Signed)
? ?  [facility] ?Provider Note ? ? ? Event Date/Time  ? First MD Initiated Contact with Patient 03/13/21 [phone]   ?  (approximate) ? ?History  ? ?Chief Complaint: Abdominal pain ?HPI ? ?Alice Hughes is a 65 y.o. female with a past medical history of diabetes, hypertension, obesity presents to the emergency department for left lower quadrant abdominal pain.  According to the patient she developed left lower quadrant abdominal pain last night along with nausea and vomiting.  Pain acutely worsened this morning so the patient came to the emergency department. ? ?Physical Exam  ? ?Triage Vital Signs: ?ED Triage Vitals [03/13/21 0823]  ?Enc Vitals Group  ?   BP   ?   Pulse Rate 84  ?   Resp (!) 25  ?   Temp   ?   Temp src   ?   SpO2 98 %  ?   Weight   ?   Height   ?   Head Circumference   ?   Peak Flow   ?   Pain Score   ?   Pain Loc   ?   Pain Edu?   ?   Excl. in [place]?   ? ? ?Most recent vital signs: ?Vitals:  ? 03/13/21 0823  ?Pulse: 84  ?Resp: (!) 25  ?SpO2: 98%  ? ? ?General: Awake, no distress.  ?CV:  Good peripheral perfusion.  Regular rate and rhythm  ?Resp:  Normal effort.  Equal breath sounds bilaterally.  ?Abd:  Slight distention.  Soft, moderate LLQ tenderness to palpation ? ? ? ?ED Results / Procedures / Treatments  ? ? ?RADIOLOGY ? ?I personally viewed the CT images I do not see any significant abnormality no sign of any obvious obstruction. ?Radiology is read the CT is likely mild diverticulitis with a small hiatal hernia.  Ventral abdominal wall hernia. ? ? ?MEDICATIONS ORDERED IN ED: ?Medications  ?fentaNYL (SUBLIMAZE) injection 50 mcg (has no administration in time range)  ?sodium chloride 0.9 % bolus 1,000 mL (has no administration in time range)  ?ondansetron (ZOFRAN) injection 4 mg (has no administration in time range)  ? ? ? ?IMPRESSION / MDM / ASSESSMENT AND PLAN / ED COURSE  ?I reviewed the triage vital signs and the nursing notes. ? ?Patient presents to the emergency department  for left lower quadrant abdominal pain as well as nausea and vomiting since last night.  Differential would include diverticulitis, colitis, small bowel obstruction, UTI or pyelonephritis.  We will check labs including blood work, urinalysis we will treat pain and nausea as well as IV hydrate while awaiting results.  We will likely proceed with CT imaging of abdomen/pelvis to further differentiate.  Patient agreeable to plan. ? ?Patient CT scan shows mild diverticulitis.  CBC shows slight leukocytosis otherwise largely baseline for the patient.  CMP largely at baseline for the patient.  Lipase is negative.  I have discussed the findings with a sign language interpreter.  Patient is agreeable to plan of care.  Provided return precautions.  We will send prescriptions to [facility]. ? ?FINAL CLINICAL IMPRESSION(S) / ED DIAGNOSES  ? ?Left lower quadrant abdominal pain ?Diverticulitis ? ?Rx / DC Orders  ? ?Percocet ?Zofran ?Colace ?Augmentin ? ?Note:  This document was prepared using Dragon voice recognition software and may include unintentional dictation errors. ?  ?Harvest Dark, MD ?03/13/21 1257 ? ?

## 2021-03-13 NOTE — ED Notes (Signed)
ALS interp used pt gave consent to dc ? ?

## 2021-03-13 NOTE — ED Notes (Signed)
Pt desaturated to 84% after fentanyl. Placed on 2L [place]., Now 90-94%. ?

## 2021-03-13 NOTE — ED Notes (Signed)
EDP attempting IV ultrasound.  ?

## 2021-03-13 NOTE — ED Triage Notes (Signed)
From home, [facility] ? ?LLQ abdominal pain and vomiting ?Pt was in pain all night ?Sudden onset, no hx of same ?No UTI symptoms, no cardiac hx ? ?EMS gave 18mg fentanyl, 22# R wrist ? ?Pt needs ASL interpreter, stick interpreter in room ? ?EMS vs: ?96%  ?CBG 234, is high for pt, usually "well managed", unsure if hx DM ? ?Pt is groaning in pain. Waiting for interpreter to come on to screen ? ?EDP at bedside. Pt pointing to LLQ abdomen indicating pain ?

## 2021-03-13 NOTE — ED Notes (Signed)
EMS IV does not draw back. Pt has veins that are very curvy. 2 IV attempts with blood return but could not advance catheter. Will inform EDP unable to get labs at this time and will start fluids etc. Interpreter still not on screen. ?

## 2021-03-13 NOTE — ED Notes (Signed)
Unable to complete all areas of triage because ASL interpreter still not on line. Using paper at this time to communicate with pt in writing. ?

## 2021-03-13 NOTE — ED Notes (Signed)
EDP at beside, interpreter now on screen. EMS IV infiltrated. EDP will start U/S IV for labs and to give fluids and meds. ?

## 2021-03-13 NOTE — ED Notes (Signed)
98% now on 1L oxygen. ?

## 2021-03-19 ENCOUNTER — Encounter (INDEPENDENT_AMBULATORY_CARE_PROVIDER_SITE_OTHER): Payer: Self-pay | Admitting: Vascular Surgery

## 2021-03-19 NOTE — Progress Notes (Signed)
? ? ?MRN : [id number] ? ?Alice Hughes is a 66 y.o. (12-08-1955) female who presents with chief complaint of leg swelling. ? ?History of Present Illness:  ?Patient is seen for evaluation of leg swelling. The patient first noticed the swelling remotely but is now concerned because of a significant increase in the overall edema. The swelling is associated with pain and discoloration. The patient notes that in the morning the legs are significantly improved but they steadily worsened throughout the course of the day. Elevation makes the legs better, dependency makes them much worse.  ? ?There is no history of ulcerations associated with the swelling.  ? ?The patient denies any recent changes in their medications. ? ?The patient has not been wearing graduated compression. ? ?The patient has no had any past angiography, interventions or vascular surgery. ? ?The patient denies a history of DVT or PE. There is no prior history of phlebitis. ?There is no history of primary lymphedema. ? ?There is no history of radiation treatment to the groin or pelvis ?No history of malignancies. ?No history of trauma or groin or pelvic surgery. ?No history of foreign travel or parasitic infections area   ? ?Current Meds  ?Medication Sig  ? folic acid (FOLVITE) 1 MG tablet Take 1 mg by mouth daily.  ? furosemide (LASIX) 20 MG tablet Take 20 mg by mouth daily.  ? gabapentin (NEURONTIN) 300 MG capsule Take 300 mg by mouth in the morning and at bedtime.  ? insulin glargine (LANTUS) 100 UNIT/ML injection Inject 70 Units into the skin at bedtime.   ? insulin regular (NOVOLIN R,HUMULIN R) 100 units/mL injection Inject 25-35 Units into the skin See admin instructions. Inject 25 units into the skin in the morning and 35 units in the evening  ? lisinopril (ZESTRIL) 20 MG tablet Take 20 mg by mouth daily.   ? metFORMIN (GLUCOPHAGE) 1000 MG tablet Take 1,000 mg by mouth 2 (two) times daily with a meal.  ? methotrexate (RHEUMATREX) 2.5 MG tablet Take  15 mg by mouth every Friday.  ? metolazone (ZAROXOLYN) 5 MG tablet Take 5 mg by mouth daily.  ? ondansetron (ZOFRAN-ODT) 4 MG disintegrating tablet Take 1 tablet (4 mg total) by mouth every 8 (eight) hours as needed for nausea or vomiting.  ? rosuvastatin (CRESTOR) 5 MG tablet Take 5 mg by mouth at bedtime.   ? ? ?Past Medical History:  ?Diagnosis Date  ? Arthritis   ? Deaf   ? Diabetes mellitus without complication ([place])   ? Gallstones   ? Heart failure ([place])   ? Hypertension   ? Obesity   ? Rheumatoid arthritis ([place])   ? Transverse myelitis ([place])   ? ? ?Past Surgical History:  ?Procedure Laterality Date  ? ABDOMINAL HYSTERECTOMY    ? CHOLECYSTECTOMY    ? RIGHT HEART CATH AND CORONARY ANGIOGRAPHY Right 05/19/2019  ? Procedure: RIGHT HEART CATH;  Surgeon: Teodoro Spray, MD;  Location: [place] [facility];  Service: Cardiovascular;  Laterality: Right;  ? ? ?Social History ?Social History  ? ?Tobacco Use  ? Smoking status: Never  ? Smokeless tobacco: Never  ?Vaping Use  ? Vaping Use: Never used  ?Substance Use Topics  ? Alcohol use: Not Currently  ? Drug use: Never  ? ? ?Family History ?Family History  ?Problem Relation Age of Onset  ? Breast cancer Sister 2  ? Diabetes Mother   ? Diabetes Father   ? Heart disease Father   ? ? ?Allergies  ?  Allergen Reactions  ? Iodinated Contrast Media Other (See Comments)  ?  Per pt she has hx of dye allergy and has iodinated contrast media allergy listed in [facility] chart. [facility]   ? ? ? ?REVIEW OF SYSTEMS (Negative unless checked) ? ?Constitutional: '[]'$ Weight loss  '[]'$ Fever  '[]'$ Chills ?Cardiac: '[]'$ Chest pain   '[]'$ Chest pressure   '[]'$ Palpitations   '[]'$ Shortness of breath when laying flat   '[]'$ Shortness of breath with exertion. ?Vascular:  '[]'$ Pain in legs with walking   '[]'$ Pain in legs at rest  '[]'$ History of DVT   '[]'$ Phlebitis   '[x]'$ Swelling in legs   '[]'$ Varicose veins   '[]'$ Non-healing ulcers ?Pulmonary:   '[]'$ Uses home oxygen   '[]'$ Productive cough   '[]'$ Hemoptysis   '[]'$ Wheeze  '[]'$ COPD   '[]'$ Asthma ?Neurologic:   '[]'$ Dizziness   '[]'$ Seizures   '[]'$ History of stroke   '[]'$ History of TIA  '[]'$ Aphasia   '[]'$ Vissual changes   '[]'$ Weakness or numbness in arm   '[]'$ Weakness or numbness in leg ?Musculoskeletal:   '[]'$ Joint swelling   '[]'$ Joint pain   '[]'$ Low back pain ?Hematologic:  '[]'$ Easy bruising  '[]'$ Easy bleeding   '[]'$ Hypercoagulable state   '[]'$ Anemic ?Gastrointestinal:  '[]'$ Diarrhea   '[]'$ Vomiting  '[]'$ Gastroesophageal reflux/heartburn   '[]'$ Difficulty swallowing. ?Genitourinary:  '[]'$ Chronic kidney disease   '[]'$ Difficult urination  '[]'$ Frequent urination   '[]'$ Blood in urine ?Skin:  '[]'$ Rashes   '[]'$ Ulcers  ?Psychological:  '[]'$ History of anxiety   '[]'$  History of major depression. ? ?Physical Examination ? ?Vitals:  ? 03/13/21 1539  ?BP: 122/74  ?Pulse: 76  ?Resp: 16  ? ?There is no height or weight on file to calculate BMI. ?Gen: WD/WN, NAD ?Head: [place]/AT, No temporalis wasting.  ?Ear/Nose/Throat: Hearing grossly intact, nares w/o erythema or drainage, pinna without lesions ?Eyes: PER, EOMI, sclera nonicteric.  ?Neck: Supple, no gross masses.  No JVD.  ?Pulmonary:  Good air movement, no audible wheezing, no use of accessory muscles.  ?Cardiac: RRR, precordium not hyperdynamic. ?Vascular:  scattered varicosities present bilaterally.  Mild venous stasis changes to the legs bilaterally.  3+ soft pitting edema left > right ?Vessel Right Left  ?Radial Palpable Palpable  ?Gastrointestinal: soft, non-distended. No guarding/no peritoneal signs.  ?Musculoskeletal: M/S 5/5 throughout.  No deformity.  ?Neurologic: CN 2-12 intact. Pain and light touch intact in extremities.  Symmetrical.  Speech is fluent. Motor exam as listed above. ?Psychiatric: Judgment intact, Mood & affect appropriate for pt's clinical situation. ?Dermatologic: Venous rashes no ulcers noted.  No changes consistent with cellulitis. ?Lymph : No lichenification or skin changes of chronic lymphedema. ? ?CBC ?Lab Results  ?Component Value Date  ? WBC 10.8 (H) 03/13/2021  ? HGB 10.5 (L) 03/13/2021  ? HCT 34.7 (L)  03/13/2021  ? MCV 93.3 03/13/2021  ? PLT 286 03/13/2021  ? ? ?BMET ?   ?Component Value Date/Time  ? NA 137 03/13/2021 0835  ? K 4.7 03/13/2021 0835  ? CL 103 03/13/2021 0835  ? CO2 26 03/13/2021 0835  ? GLUCOSE 230 (H) 03/13/2021 0835  ? BUN 20 03/13/2021 0835  ? CREATININE 1.21 (H) 03/13/2021 0835  ? CALCIUM 8.9 03/13/2021 0835  ? GFRNONAA 49 (L) 03/13/2021 0835  ? GFRAA >60 09/27/2017 1444  ? ?Estimated Creatinine Clearance: 58.9 mL/min (A) (by C-G formula based on SCr of 1.21 mg/dL (H)). ? ?COAG ?No results found for: INR, PROTIME ? ?Radiology ?CT ABDOMEN PELVIS WO CONTRAST ? ?Result Date: 03/13/2021 ?CLINICAL DATA:  Left lower quadrant pain EXAM: CT ABDOMEN AND PELVIS WITHOUT CONTRAST TECHNIQUE: Multidetector CT imaging of the  abdomen and pelvis was performed following the standard protocol without IV contrast. RADIATION DOSE REDUCTION: This exam was performed according to the departmental dose-optimization program which includes automated exposure control, adjustment of the mA and/or kV according to patient size and/or use of iterative reconstruction technique. COMPARISON:  CT abdomen and pelvis 02/23/2014 FINDINGS: Lower chest: Subsegmental atelectatic changes in the lung bases. Hepatobiliary: Liver is mildly enlarged measuring 18.9 cm in length. No suspicious hepatic mass identified. Gallbladder is surgically absent. No biliary ductal dilatation. Pancreas: Unremarkable. No pancreatic ductal dilatation or surrounding inflammatory changes. Spleen: Normal in size without focal abnormality. Adrenals/Urinary Tract: Adrenal glands are unremarkable. Kidneys are normal, without renal calculi, focal lesion, or hydronephrosis. Bladder is unremarkable. Stomach/Bowel: Small hiatal hernia. No bowel obstruction, free air or pneumatosis. Colonic diverticulosis. Mild fat stranding around the proximal sigmoid colon in the left pelvis. Appendix not visualized. Vascular/Lymphatic: Aortic atherosclerosis. No enlarged abdominal  or pelvic lymph nodes. Reproductive: Status post hysterectomy. No adnexal masses. Other: Midline supraumbilical abdominal wall hernia which contains fat and loops of small bowel. The hernia defect measures

## 2021-04-10 DIAGNOSIS — M059 Rheumatoid arthritis with rheumatoid factor, unspecified: Secondary | ICD-10-CM | POA: Diagnosis not present

## 2021-04-10 DIAGNOSIS — Z79899 Other long term (current) drug therapy: Secondary | ICD-10-CM | POA: Diagnosis not present

## 2021-04-10 DIAGNOSIS — Z7960 Long term (current) use of unspecified immunomodulators and immunosuppressants: Secondary | ICD-10-CM | POA: Diagnosis not present

## 2021-04-12 ENCOUNTER — Ambulatory Visit: Payer: Medicare HMO

## 2021-04-12 ENCOUNTER — Ambulatory Visit (LOCAL_COMMUNITY_HEALTH_CENTER): Payer: Medicare HMO

## 2021-04-12 DIAGNOSIS — Z719 Counseling, unspecified: Secondary | ICD-10-CM

## 2021-04-12 DIAGNOSIS — Z23 Encounter for immunization: Secondary | ICD-10-CM

## 2021-04-12 NOTE — Progress Notes (Signed)
?  Are you feeling sick today? No ? ? ?Have you ever received a dose of COVID-19 Vaccine? [employer], [place], [place], [place], Other) Yes ? ?If yes, which vaccine and how many doses?   Pfizer primary series 2 doses, Pfizer monovalent booster 1 dose ? ? ?Did you bring the vaccination record card or other documentation?  Yes ? ? ?Do you have a health condition or are undergoing treatment that makes you moderately or severely immunocompromised? This would include, but not be limited to: cancer, HIV, organ transplant, immunosuppressive therapy/high-dose corticosteroids, or moderate/severe primary immunodeficiency.  No ? ?Have you received COVID-19 vaccine before or during hematopoietic cell transplant (HCT) or CAR-T-cell therapies? No ? ?Have you ever had an allergic reaction to: (This would include a severe allergic reaction or a reaction that caused hives, swelling, or respiratory distress, including wheezing.) A component of a COVID-19 vaccine or a previous dose of COVID-19 vaccine? No ? ? ?Have you ever had an allergic reaction to another vaccine (other thanCOVID-19 vaccine) or an injectable medication? (This would include a severe allergic reaction or a reaction that caused hives, swelling, or respiratory distress, including wheezing.)   No ?  ?Do you have a history of any of the following: ? ?Myocarditis or Pericarditis No ?Thrombosis with thrombocytopenia syndrome (TTS) No ?Multisystem Inflammatory Syndrome (MIS-C or MIS-A)? No ?Immune-mediate syndrome defined by thrombosis and thrombocytopenia, such as heparin--induced thrombocytopenia (HIT)  No ?Guillain-Barr? Syndrome (GBS) No ?COVID-19 disease within the past 3 months? No ?Vaccinated with monkeypox vaccine in the last 4 weeks? No ? ?[facility] and COVID card updated and given to patient. Ranae Palms, RN ? ?

## 2021-04-13 ENCOUNTER — Ambulatory Visit: Payer: Medicare HMO

## 2021-04-13 ENCOUNTER — Ambulatory Visit: Payer: Self-pay

## 2021-04-20 ENCOUNTER — Encounter (INDEPENDENT_AMBULATORY_CARE_PROVIDER_SITE_OTHER): Payer: Medicare HMO

## 2021-04-20 ENCOUNTER — Ambulatory Visit (INDEPENDENT_AMBULATORY_CARE_PROVIDER_SITE_OTHER): Payer: Medicare HMO | Admitting: Vascular Surgery

## 2021-04-27 ENCOUNTER — Encounter (INDEPENDENT_AMBULATORY_CARE_PROVIDER_SITE_OTHER): Payer: Medicare HMO

## 2021-04-27 ENCOUNTER — Ambulatory Visit (INDEPENDENT_AMBULATORY_CARE_PROVIDER_SITE_OTHER): Payer: Medicare HMO | Admitting: Vascular Surgery

## 2021-05-02 ENCOUNTER — Encounter (INDEPENDENT_AMBULATORY_CARE_PROVIDER_SITE_OTHER): Payer: Self-pay | Admitting: Nurse Practitioner

## 2021-05-02 ENCOUNTER — Ambulatory Visit (INDEPENDENT_AMBULATORY_CARE_PROVIDER_SITE_OTHER): Payer: Medicare HMO

## 2021-05-02 ENCOUNTER — Ambulatory Visit (INDEPENDENT_AMBULATORY_CARE_PROVIDER_SITE_OTHER): Payer: Medicare HMO | Admitting: Nurse Practitioner

## 2021-05-02 VITALS — BP 136/78 | HR 76 | Resp 16

## 2021-05-02 DIAGNOSIS — E1169 Type 2 diabetes mellitus with other specified complication: Secondary | ICD-10-CM | POA: Diagnosis not present

## 2021-05-02 DIAGNOSIS — E669 Obesity, unspecified: Secondary | ICD-10-CM

## 2021-05-02 DIAGNOSIS — M79604 Pain in right leg: Secondary | ICD-10-CM

## 2021-05-02 DIAGNOSIS — I872 Venous insufficiency (chronic) (peripheral): Secondary | ICD-10-CM

## 2021-05-02 DIAGNOSIS — I10 Essential (primary) hypertension: Secondary | ICD-10-CM

## 2021-05-02 DIAGNOSIS — I890 Lymphedema, not elsewhere classified: Secondary | ICD-10-CM | POA: Diagnosis not present

## 2021-05-02 DIAGNOSIS — M79605 Pain in left leg: Secondary | ICD-10-CM

## 2021-05-02 DIAGNOSIS — IMO0001 Reserved for inherently not codable concepts without codable children: Secondary | ICD-10-CM | POA: Insufficient documentation

## 2021-05-02 DIAGNOSIS — H9190 Unspecified hearing loss, unspecified ear: Secondary | ICD-10-CM | POA: Insufficient documentation

## 2021-05-14 ENCOUNTER — Encounter (INDEPENDENT_AMBULATORY_CARE_PROVIDER_SITE_OTHER): Payer: Self-pay | Admitting: Nurse Practitioner

## 2021-05-14 NOTE — Progress Notes (Signed)
? ?Subjective:  ? ? Patient ID: Alice Hughes, female    DOB: [birth year], 66 y.o.   MRN: [id number] ?Chief Complaint  ?Patient presents with  ? Follow-up  ?  Ultrasound follow up  ? ? ?Alice Hughes is a 66 year old female who presents today for follow-up evaluation of her lower extremity edema.  The patient notes that she has worn compression but now it has holes in it.  She notes that she is mainly in her wheelchair most of the day.  She also notes that she cannot walk for much further than several steps without her legs completely giving out collapsing underneath her.  Her biggest concern is of her collapsing lower extremities.  The swelling does also bother her ? ?She notes that her swelling is worse throughout the day.  She notes that she spends majority of her day sitting in either wheelchair or in a chair at home. ? ?Noninvasive studies today show no evidence of DVT or superficial thrombophlebitis bilaterally.  The studies for limited.  No evidence of deep venous insufficiency or superficial venous reflux bilaterally. ? ? ?Review of Systems  ?Cardiovascular:  Positive for leg swelling.  ?Musculoskeletal:  Positive for back pain and myalgias.  ?Neurological:  Positive for weakness.  ?All other systems reviewed and are negative. ? ?   ?Objective:  ? Physical Exam ?Vitals reviewed.  ?HENT:  ?   Head: Normocephalic.  ?Cardiovascular:  ?   Rate and Rhythm: Normal rate.  ?Pulmonary:  ?   Effort: Pulmonary effort is normal.  ?Musculoskeletal:  ?   Right lower leg: 2+ Edema present.  ?   Left lower leg: 2+ Edema present.  ?Neurological:  ?   Mental Status: She is alert and oriented to person, place, and time.  ?   Motor: Weakness present.  ?   Gait: Gait abnormal.  ?Psychiatric:     ?   Mood and Affect: Mood normal.     ?   Behavior: Behavior normal.     ?   Thought Content: Thought content normal.     ?   Judgment: Judgment normal.  ? ? ?BP 136/78 (BP Location: Left Arm)   Pulse 76   Resp 16  ? ?Past Medical  History:  ?Diagnosis Date  ? Arthritis   ? Deaf   ? Diabetes mellitus without complication ([place])   ? Gallstones   ? Heart failure ([place])   ? Hypertension   ? Obesity   ? Rheumatoid arthritis ([place])   ? Transverse myelitis ([place])   ? ? ?Social History  ? ?Socioeconomic History  ? Marital status: Married  ?  Spouse name: Not on file  ? Number of children: Not on file  ? Years of education: Not on file  ? Highest education level: Not on file  ?Occupational History  ? Not on file  ?Tobacco Use  ? Smoking status: Never  ? Smokeless tobacco: Never  ?Vaping Use  ? Vaping Use: Never used  ?Substance and Sexual Activity  ? Alcohol use: Not Currently  ? Drug use: Never  ? Sexual activity: Not on file  ?Other Topics Concern  ? Not on file  ?Social History Narrative  ? Not on file  ? ?Social Determinants of Health  ? ?Financial Resource Strain: Not on file  ?Food Insecurity: Not on file  ?Transportation Needs: Not on file  ?Physical Activity: Not on file  ?Stress: Not on file  ?Social Connections: Not on file  ?Intimate Partner Violence: Not  on file  ? ? ?Past Surgical History:  ?Procedure Laterality Date  ? ABDOMINAL HYSTERECTOMY    ? CHOLECYSTECTOMY    ? RIGHT HEART CATH AND CORONARY ANGIOGRAPHY Right 05/19/2019  ? Procedure: RIGHT HEART CATH;  Surgeon: Teodoro Spray, MD;  Location: [facility];  Service: Cardiovascular;  Laterality: Right;  ? ? ?Family History  ?Problem Relation Age of Onset  ? Breast cancer Sister 27  ? Diabetes Mother   ? Diabetes Father   ? Heart disease Father   ? ? ?Allergies  ?Allergen Reactions  ? Iodinated Contrast Media Other (See Comments)  ?  Per pt she has hx of dye allergy and has iodinated contrast media allergy listed in [facility] chart. MS[facility]   ? ? ? ?  Latest Ref Rng & Units 03/13/2021  ?  8:35 AM 12/11/2019  ?  3:49 PM 09/27/2017  ?  2:44 PM  ?CBC  ?WBC 4.0 - 10.5 K/uL 10.8   10.6   10.1    ?Hemoglobin 12.0 - 15.0 g/dL 10.5   10.9   11.8    ?Hematocrit 36.0 - 46.0 % 34.7   34.9   36.7     ?Platelets 150 - 400 K/uL 286   295   305    ? ? ? ? ?CMP  ?   ?Component Value Date/Time  ? NA 137 03/13/2021 0835  ? K 4.7 03/13/2021 0835  ? CL 103 03/13/2021 0835  ? CO2 26 03/13/2021 0835  ? GLUCOSE 230 (H) 03/13/2021 0835  ? BUN 20 03/13/2021 0835  ? CREATININE 1.21 (H) 03/13/2021 0835  ? CALCIUM 8.9 03/13/2021 0835  ? PROT 8.0 03/13/2021 0835  ? ALBUMIN 3.7 03/13/2021 0835  ? AST 36 03/13/2021 0835  ? ALT 28 03/13/2021 0835  ? ALKPHOS 48 03/13/2021 0835  ? BILITOT 0.9 03/13/2021 0835  ? GFRNONAA 49 (L) 03/13/2021 0835  ? GFRAA >60 09/27/2017 1444  ? ? ? ?No results found. ? ?   ?Assessment & Plan:  ? ?1. Pain in both lower extremities ?Although patient does have lower extremity lymphedema, this is not the root cause of her issues are major complaints.  Lymphedema will not cause an inability to stand or walk.  The patient notes that she can barely walk without her feet and legs collapsing with significant weakness.  Please MRI as noted significant degenerative changes to lumbar spine.  We will refer patient to neurology for causes of sudden lower extremity weakness. ?- Ambulatory referral to Neurosurgery ? ?2. Essential hypertension ?Continue antihypertensive medications as already ordered, these medications have been reviewed and there are no changes at this time.  ? ?3. Diabetes mellitus type 2 in obese [facility]) ?Continue hypoglycemic medications as already ordered, these medications have been reviewed and there are no changes at this time. ? ?Hgb A1C to be monitored as already arranged by primary service  ? ? ?4. Lymphedema ?Recommend: ? ?I have had a long discussion with the patient regarding swelling and why it  causes symptoms.  Patient will begin wearing graduated compression on a daily basis a prescription was given. The patient will  wear the stockings first thing in the morning and removing them in the evening. The patient is instructed specifically not to sleep in the stockings.  ? ?In addition,  behavioral modification will be initiated.  This will include frequent elevation, use of over the counter pain medications and exercise such as walking. ? ?Consideration for a lymph pump will also  be made based upon the effectiveness of conservative therapy.  This would help to improve the edema control and prevent sequela such as ulcers and infections  ? ?We will have the patient return in 3 months with evaluation of lower extremity edema for possible evaluation of lymphedema pump. ? ?Current Outpatient Medications on File Prior to Visit  ?Medication Sig Dispense Refill  ? folic acid (FOLVITE) 1 MG tablet Take 1 mg by mouth daily.    ? furosemide (LASIX) 20 MG tablet Take 20 mg by mouth daily.    ? gabapentin (NEURONTIN) 300 MG capsule Take 300 mg by mouth 2 (two) times daily.    ? insulin glargine (LANTUS) 100 UNIT/ML injection Inject 70 Units into the skin at bedtime.     ? lisinopril (ZESTRIL) 20 MG tablet Take 20 mg by mouth daily.     ? metFORMIN (GLUCOPHAGE) 1000 MG tablet Take 1,000 mg by mouth 2 (two) times daily with a meal.    ? methotrexate (RHEUMATREX) 2.5 MG tablet Take 15 mg by mouth every Friday.    ? metolazone (ZAROXOLYN) 5 MG tablet Take 5 mg by mouth daily.    ? NOVOLOG FLEXPEN 100 UNIT/ML FlexPen Inject into the skin. 40 units in morning and 50units in evening    ? ondansetron (ZOFRAN-ODT) 4 MG disintegrating tablet Take 1 tablet (4 mg total) by mouth every 8 (eight) hours as needed for nausea or vomiting. 20 tablet 0  ? rosuvastatin (CRESTOR) 5 MG tablet Take 5 mg by mouth at bedtime.     ? ANORO ELLIPTA 62.5-25 MCG/INH AEPB Inhale 1 puff into the lungs in the morning.  (Patient not taking: Reported on 12/11/2019)    ? cyclobenzaprine (FLEXERIL) 5 MG tablet 1 tablet every 8 hours as he did for muscle spasms (Patient not taking: Reported on 05/08/2019) 15 tablet 0  ? docusate sodium (COLACE) 100 MG capsule Take 1 capsule (100 mg total) by mouth 2 (two) times daily. (Patient not taking: Reported on  03/13/2021) 30 capsule 0  ? glimepiride (AMARYL) 4 MG tablet Take 4 mg by mouth daily with breakfast. (Patient not taking: Reported on 03/13/2021)    ? HYDROcodone-acetaminophen (NORCO) 5-325 MG tablet Take 1 tablet by

## 2021-06-07 DIAGNOSIS — I272 Pulmonary hypertension, unspecified: Secondary | ICD-10-CM | POA: Diagnosis not present

## 2021-06-07 DIAGNOSIS — I5030 Unspecified diastolic (congestive) heart failure: Secondary | ICD-10-CM | POA: Diagnosis not present

## 2021-06-07 DIAGNOSIS — I10 Essential (primary) hypertension: Secondary | ICD-10-CM | POA: Diagnosis not present

## 2021-06-07 DIAGNOSIS — I872 Venous insufficiency (chronic) (peripheral): Secondary | ICD-10-CM | POA: Diagnosis not present

## 2021-06-20 DIAGNOSIS — G959 Disease of spinal cord, unspecified: Secondary | ICD-10-CM | POA: Diagnosis not present

## 2021-06-20 DIAGNOSIS — R531 Weakness: Secondary | ICD-10-CM | POA: Diagnosis not present

## 2021-07-07 DIAGNOSIS — M4056 Lordosis, unspecified, lumbar region: Secondary | ICD-10-CM | POA: Diagnosis not present

## 2021-07-07 DIAGNOSIS — D1779 Benign lipomatous neoplasm of other sites: Secondary | ICD-10-CM | POA: Diagnosis not present

## 2021-07-07 DIAGNOSIS — M4807 Spinal stenosis, lumbosacral region: Secondary | ICD-10-CM | POA: Diagnosis not present

## 2021-07-07 DIAGNOSIS — M4312 Spondylolisthesis, cervical region: Secondary | ICD-10-CM | POA: Diagnosis not present

## 2021-07-07 DIAGNOSIS — M48061 Spinal stenosis, lumbar region without neurogenic claudication: Secondary | ICD-10-CM | POA: Diagnosis not present

## 2021-07-07 DIAGNOSIS — M4802 Spinal stenosis, cervical region: Secondary | ICD-10-CM | POA: Diagnosis not present

## 2021-07-07 DIAGNOSIS — M5104 Intervertebral disc disorders with myelopathy, thoracic region: Secondary | ICD-10-CM | POA: Diagnosis not present

## 2021-07-07 DIAGNOSIS — M438X4 Other specified deforming dorsopathies, thoracic region: Secondary | ICD-10-CM | POA: Diagnosis not present

## 2021-07-07 DIAGNOSIS — G959 Disease of spinal cord, unspecified: Secondary | ICD-10-CM | POA: Diagnosis not present

## 2021-07-07 DIAGNOSIS — M40202 Unspecified kyphosis, cervical region: Secondary | ICD-10-CM | POA: Diagnosis not present

## 2021-07-10 ENCOUNTER — Other Ambulatory Visit: Payer: Self-pay

## 2021-07-13 ENCOUNTER — Other Ambulatory Visit: Payer: Self-pay

## 2021-07-13 DIAGNOSIS — R531 Weakness: Secondary | ICD-10-CM

## 2021-07-13 NOTE — Progress Notes (Signed)
MRI brain, cervical, thoracic, lumbar at [facility] 07/07/21

## 2021-07-17 DIAGNOSIS — E1122 Type 2 diabetes mellitus with diabetic chronic kidney disease: Secondary | ICD-10-CM | POA: Diagnosis not present

## 2021-07-17 DIAGNOSIS — Z794 Long term (current) use of insulin: Secondary | ICD-10-CM | POA: Diagnosis not present

## 2021-07-17 DIAGNOSIS — N1832 Chronic kidney disease, stage 3b: Secondary | ICD-10-CM | POA: Diagnosis not present

## 2021-07-17 NOTE — Progress Notes (Signed)
Images received - need to be matched in [facility]

## 2021-07-19 ENCOUNTER — Ambulatory Visit
Admission: RE | Admit: 2021-07-19 | Discharge: 2021-07-19 | Disposition: A | Discharge status: Home / Self Care | Payer: Self-pay | Source: Ambulatory Visit | Attending: Neurosurgery | Admitting: Neurosurgery

## 2021-07-19 DIAGNOSIS — R531 Weakness: Secondary | ICD-10-CM

## 2021-07-19 NOTE — Progress Notes (Signed)
Notified radiology to match images in [facility]

## 2021-07-24 DIAGNOSIS — E1122 Type 2 diabetes mellitus with diabetic chronic kidney disease: Secondary | ICD-10-CM | POA: Diagnosis not present

## 2021-07-24 DIAGNOSIS — I10 Essential (primary) hypertension: Secondary | ICD-10-CM | POA: Diagnosis not present

## 2021-07-24 DIAGNOSIS — E113293 Type 2 diabetes mellitus with mild nonproliferative diabetic retinopathy without macular edema, bilateral: Secondary | ICD-10-CM | POA: Diagnosis not present

## 2021-07-24 DIAGNOSIS — E1169 Type 2 diabetes mellitus with other specified complication: Secondary | ICD-10-CM | POA: Diagnosis not present

## 2021-07-24 DIAGNOSIS — Z794 Long term (current) use of insulin: Secondary | ICD-10-CM | POA: Diagnosis not present

## 2021-07-24 DIAGNOSIS — E669 Obesity, unspecified: Secondary | ICD-10-CM | POA: Diagnosis not present

## 2021-07-24 DIAGNOSIS — N1832 Chronic kidney disease, stage 3b: Secondary | ICD-10-CM | POA: Diagnosis not present

## 2021-07-24 DIAGNOSIS — N1831 Chronic kidney disease, stage 3a: Secondary | ICD-10-CM | POA: Diagnosis not present

## 2021-08-03 ENCOUNTER — Ambulatory Visit (INDEPENDENT_AMBULATORY_CARE_PROVIDER_SITE_OTHER): Payer: Medicare HMO | Admitting: Vascular Surgery

## 2021-08-03 ENCOUNTER — Ambulatory Visit (INDEPENDENT_AMBULATORY_CARE_PROVIDER_SITE_OTHER): Payer: 59 | Admitting: Neurosurgery

## 2021-08-03 DIAGNOSIS — R531 Weakness: Secondary | ICD-10-CM

## 2021-08-03 NOTE — Progress Notes (Signed)
I called Alice Hughes to review her MRIs and talked with her via phone translator. Her MRIs do not show any spinal cord or intracranial pathology that explains her weakness. I have placed a referral to neurology for further evaluation of this. She was encouraged to call our office with any questions or concerns. I will otherwise see her going forward on an as needed basis

## 2021-08-14 DIAGNOSIS — Z7960 Long term (current) use of unspecified immunomodulators and immunosuppressants: Secondary | ICD-10-CM | POA: Diagnosis not present

## 2021-08-14 DIAGNOSIS — E1142 Type 2 diabetes mellitus with diabetic polyneuropathy: Secondary | ICD-10-CM | POA: Diagnosis not present

## 2021-08-14 DIAGNOSIS — M0579 Rheumatoid arthritis with rheumatoid factor of multiple sites without organ or systems involvement: Secondary | ICD-10-CM | POA: Diagnosis not present

## 2021-08-20 NOTE — Progress Notes (Deleted)
MRN : [id number]  Alice Hughes is a 66 y.o. (Jan 23, 1955) female who presents with chief complaint of legs swell.  History of Present Illness:   The patient returns to the office for followup evaluation regarding leg swelling.  The swelling has persisted and the pain associated with swelling continues. There have not been any interval development of a ulcerations or wounds.  Since the previous visit the patient has been wearing graduated compression stockings and has noted little if any improvement in the lymphedema. The patient has been using compression routinely morning until night.  The patient also states elevation during the day and exercise is being done too.   No outpatient medications have been marked as taking for the 08/21/21 encounter (Appointment) with Delana Meyer, Dolores Lory, MD.    Past Medical History:  Diagnosis Date   Arthritis    Deaf    Diabetes mellitus without complication ([place])    Gallstones    Heart failure ([place])    Hypertension    Obesity    Rheumatoid arthritis ([place])    Transverse myelitis ([place])     Past Surgical History:  Procedure Laterality Date   ABDOMINAL HYSTERECTOMY     CHOLECYSTECTOMY     RIGHT HEART CATH AND CORONARY ANGIOGRAPHY Right 05/19/2019   Procedure: RIGHT HEART CATH;  Surgeon: Teodoro Spray, MD;  Location: [facility];  Service: Cardiovascular;  Laterality: Right;    Social History Social History   Tobacco Use   Smoking status: Never   Smokeless tobacco: Never  Vaping Use   Vaping Use: Never used  Substance Use Topics   Alcohol use: Not Currently   Drug use: Never    Family History Family History  Problem Relation Age of Onset   Breast cancer Sister 2   Diabetes Mother    Diabetes Father    Heart disease Father     Allergies  Allergen Reactions   Iodinated Contrast Media Other (See Comments)    Per pt she has hx of dye allergy and has iodinated contrast media allergy listed in [facility] chart. MSY       REVIEW OF SYSTEMS (Negative unless checked)  Constitutional: '[]'$ Weight loss  '[]'$ Fever  '[]'$ Chills Cardiac: '[]'$ Chest pain   '[]'$ Chest pressure   '[]'$ Palpitations   '[]'$ Shortness of breath when laying flat   '[]'$ Shortness of breath with exertion. Vascular:  '[]'$ Pain in legs with walking   '[x]'$ Pain in legs with standing  '[]'$ History of DVT   '[]'$ Phlebitis   '[x]'$ Swelling in legs   '[]'$ Varicose veins   '[]'$ Non-healing ulcers Pulmonary:   '[]'$ Uses home oxygen   '[]'$ Productive cough   '[]'$ Hemoptysis   '[]'$ Wheeze  '[]'$ COPD   '[]'$ Asthma Neurologic:  '[]'$ Dizziness   '[]'$ Seizures   '[]'$ History of stroke   '[]'$ History of TIA  '[]'$ Aphasia   '[]'$ Vissual changes   '[]'$ Weakness or numbness in arm   '[]'$ Weakness or numbness in leg Musculoskeletal:   '[]'$ Joint swelling   '[]'$ Joint pain   '[]'$ Low back pain Hematologic:  '[]'$ Easy bruising  '[]'$ Easy bleeding   '[]'$ Hypercoagulable state   '[]'$ Anemic Gastrointestinal:  '[]'$ Diarrhea   '[]'$ Vomiting  '[]'$ Gastroesophageal reflux/heartburn   '[]'$ Difficulty swallowing. Genitourinary:  '[]'$ Chronic kidney disease   '[]'$ Difficult urination  '[]'$ Frequent urination   '[]'$ Blood in urine Skin:  '[]'$ Rashes   '[]'$ Ulcers  Psychological:  '[]'$ History of anxiety   '[]'$  History of major depression.  Physical Examination  There were no vitals filed for this visit. There is no height or weight on file to calculate BMI. Gen: WD/WN,  NAD Head: [place]/AT, No temporalis wasting.  Ear/Nose/Throat: Hearing grossly intact, nares w/o erythema or drainage, pinna without lesions Eyes: PER, EOMI, sclera nonicteric.  Neck: Supple, no gross masses.  No JVD.  Pulmonary:  Good air movement, no audible wheezing, no use of accessory muscles.  Cardiac: RRR, precordium not hyperdynamic. Vascular:  scattered varicosities present bilaterally.  Mild venous stasis changes to the legs bilaterally.  3-4+ soft pitting edema  Vessel Right Left  Radial Palpable Palpable  Gastrointestinal: soft, non-distended. No guarding/no peritoneal signs.  Musculoskeletal: M/S 5/5 throughout.  No deformity.   Neurologic: CN 2-12 intact. Pain and light touch intact in extremities.  Symmetrical.  Speech is fluent. Motor exam as listed above. Psychiatric: Judgment intact, Mood & affect appropriate for pt's clinical situation. Dermatologic: Venous rashes no ulcers noted.  No changes consistent with cellulitis. Lymph : No lichenification or skin changes of chronic lymphedema.  CBC Lab Results  Component Value Date   WBC 10.8 (H) 03/13/2021   HGB 10.5 (L) 03/13/2021   HCT 34.7 (L) 03/13/2021   MCV 93.3 03/13/2021   PLT 286 03/13/2021    BMET    Component Value Date/Time   NA 137 03/13/2021 0835   K 4.7 03/13/2021 0835   CL 103 03/13/2021 0835   CO2 26 03/13/2021 0835   GLUCOSE 230 (H) 03/13/2021 0835   BUN 20 03/13/2021 0835   CREATININE 1.21 (H) 03/13/2021 0835   CALCIUM 8.9 03/13/2021 0835   GFRNONAA 49 (L) 03/13/2021 0835   GFRAA >60 09/27/2017 1444   CrCl cannot be calculated (Patient's most recent lab result is older than the maximum 21 days allowed.).  COAG No results found for: "INR", "PROTIME"  Radiology No results found.   Assessment/Plan There are no diagnoses linked to this encounter.   Hortencia Pilar, MD  08/20/2021 5:07 PM

## 2021-08-21 ENCOUNTER — Ambulatory Visit (INDEPENDENT_AMBULATORY_CARE_PROVIDER_SITE_OTHER): Payer: Medicare HMO | Admitting: Vascular Surgery

## 2021-08-21 DIAGNOSIS — E1169 Type 2 diabetes mellitus with other specified complication: Secondary | ICD-10-CM

## 2021-08-21 DIAGNOSIS — E782 Mixed hyperlipidemia: Secondary | ICD-10-CM

## 2021-08-21 DIAGNOSIS — I890 Lymphedema, not elsewhere classified: Secondary | ICD-10-CM

## 2021-08-21 DIAGNOSIS — I10 Essential (primary) hypertension: Secondary | ICD-10-CM

## 2021-08-21 DIAGNOSIS — I872 Venous insufficiency (chronic) (peripheral): Secondary | ICD-10-CM

## 2021-09-05 DIAGNOSIS — R748 Abnormal levels of other serum enzymes: Secondary | ICD-10-CM | POA: Diagnosis not present

## 2021-10-09 DIAGNOSIS — R748 Abnormal levels of other serum enzymes: Secondary | ICD-10-CM | POA: Diagnosis not present

## 2021-10-09 DIAGNOSIS — Z7960 Long term (current) use of unspecified immunomodulators and immunosuppressants: Secondary | ICD-10-CM | POA: Diagnosis not present

## 2021-10-18 DIAGNOSIS — Z794 Long term (current) use of insulin: Secondary | ICD-10-CM | POA: Diagnosis not present

## 2021-10-18 DIAGNOSIS — E1122 Type 2 diabetes mellitus with diabetic chronic kidney disease: Secondary | ICD-10-CM | POA: Diagnosis not present

## 2021-10-18 DIAGNOSIS — N1832 Chronic kidney disease, stage 3b: Secondary | ICD-10-CM | POA: Diagnosis not present

## 2021-10-18 DIAGNOSIS — E113293 Type 2 diabetes mellitus with mild nonproliferative diabetic retinopathy without macular edema, bilateral: Secondary | ICD-10-CM | POA: Diagnosis not present

## 2021-10-24 DIAGNOSIS — I10 Essential (primary) hypertension: Secondary | ICD-10-CM | POA: Diagnosis not present

## 2021-10-24 DIAGNOSIS — N1832 Chronic kidney disease, stage 3b: Secondary | ICD-10-CM | POA: Diagnosis not present

## 2021-10-24 DIAGNOSIS — E1122 Type 2 diabetes mellitus with diabetic chronic kidney disease: Secondary | ICD-10-CM | POA: Diagnosis not present

## 2021-10-24 DIAGNOSIS — Z794 Long term (current) use of insulin: Secondary | ICD-10-CM | POA: Diagnosis not present

## 2021-10-24 DIAGNOSIS — E1169 Type 2 diabetes mellitus with other specified complication: Secondary | ICD-10-CM | POA: Diagnosis not present

## 2021-10-24 DIAGNOSIS — E113293 Type 2 diabetes mellitus with mild nonproliferative diabetic retinopathy without macular edema, bilateral: Secondary | ICD-10-CM | POA: Diagnosis not present

## 2021-10-24 DIAGNOSIS — E669 Obesity, unspecified: Secondary | ICD-10-CM | POA: Diagnosis not present

## 2021-11-22 DIAGNOSIS — Z794 Long term (current) use of insulin: Secondary | ICD-10-CM | POA: Diagnosis not present

## 2021-11-22 DIAGNOSIS — E113293 Type 2 diabetes mellitus with mild nonproliferative diabetic retinopathy without macular edema, bilateral: Secondary | ICD-10-CM | POA: Diagnosis not present

## 2021-11-30 DIAGNOSIS — Z0000 Encounter for general adult medical examination without abnormal findings: Secondary | ICD-10-CM | POA: Diagnosis not present

## 2021-11-30 DIAGNOSIS — E1122 Type 2 diabetes mellitus with diabetic chronic kidney disease: Secondary | ICD-10-CM | POA: Diagnosis not present

## 2021-11-30 DIAGNOSIS — R29898 Other symptoms and signs involving the musculoskeletal system: Secondary | ICD-10-CM | POA: Diagnosis not present

## 2021-11-30 DIAGNOSIS — I5030 Unspecified diastolic (congestive) heart failure: Secondary | ICD-10-CM | POA: Diagnosis not present

## 2021-11-30 DIAGNOSIS — E1140 Type 2 diabetes mellitus with diabetic neuropathy, unspecified: Secondary | ICD-10-CM | POA: Diagnosis not present

## 2021-11-30 DIAGNOSIS — E785 Hyperlipidemia, unspecified: Secondary | ICD-10-CM | POA: Diagnosis not present

## 2021-11-30 DIAGNOSIS — I10 Essential (primary) hypertension: Secondary | ICD-10-CM | POA: Diagnosis not present

## 2021-11-30 DIAGNOSIS — E1169 Type 2 diabetes mellitus with other specified complication: Secondary | ICD-10-CM | POA: Diagnosis not present

## 2021-11-30 DIAGNOSIS — M0579 Rheumatoid arthritis with rheumatoid factor of multiple sites without organ or systems involvement: Secondary | ICD-10-CM | POA: Diagnosis not present

## 2021-12-05 ENCOUNTER — Other Ambulatory Visit: Payer: Self-pay | Admitting: Family Medicine

## 2021-12-05 DIAGNOSIS — Z1231 Encounter for screening mammogram for malignant neoplasm of breast: Secondary | ICD-10-CM

## 2021-12-06 ENCOUNTER — Telehealth: Payer: Self-pay | Admitting: *Deleted

## 2021-12-06 NOTE — Patient Outreach (Signed)
  Care Coordination   12/06/2021 Name: Alice Hughes MRN: [id number] DOB: [birth year]   Care Coordination Outreach Attempts:  An unsuccessful telephone outreach was attempted today to offer the patient information about available care coordination services as a benefit of their health plan.   Follow Up Plan:  Additional outreach attempts will be made to offer the patient care coordination information and services.   Encounter Outcome:  No Answer   Care Coordination Interventions:  No, not indicated    Valente David, RN, MSN, [facility] Care Management Care Management Coordinator [phone]

## 2021-12-07 DIAGNOSIS — I272 Pulmonary hypertension, unspecified: Secondary | ICD-10-CM | POA: Diagnosis not present

## 2021-12-07 DIAGNOSIS — E1122 Type 2 diabetes mellitus with diabetic chronic kidney disease: Secondary | ICD-10-CM | POA: Diagnosis not present

## 2021-12-07 DIAGNOSIS — I10 Essential (primary) hypertension: Secondary | ICD-10-CM | POA: Diagnosis not present

## 2021-12-07 DIAGNOSIS — I5032 Chronic diastolic (congestive) heart failure: Secondary | ICD-10-CM | POA: Diagnosis not present

## 2021-12-07 DIAGNOSIS — N1832 Chronic kidney disease, stage 3b: Secondary | ICD-10-CM | POA: Diagnosis not present

## 2021-12-07 DIAGNOSIS — Z794 Long term (current) use of insulin: Secondary | ICD-10-CM | POA: Diagnosis not present

## 2021-12-12 ENCOUNTER — Telehealth: Payer: Self-pay | Admitting: *Deleted

## 2021-12-12 NOTE — Patient Outreach (Signed)
  Care Coordination   12/12/2021 Name: Lester Platas MRN: [id number] DOB: May 10, 1955   Care Coordination Outreach Attempts:  A second unsuccessful outreach was attempted today to offer the patient with information about available care coordination services as a benefit of their health plan.     Follow Up Plan:  Additional outreach attempts will be made to offer the patient care coordination information and services.   Encounter Outcome:  No Answer   Care Coordination Interventions:  No, not indicated    Valente David, RN, MSN, [facility] Care Management Care Management Coordinator [phone]

## 2021-12-14 DIAGNOSIS — M0579 Rheumatoid arthritis with rheumatoid factor of multiple sites without organ or systems involvement: Secondary | ICD-10-CM | POA: Diagnosis not present

## 2021-12-14 DIAGNOSIS — Z79899 Other long term (current) drug therapy: Secondary | ICD-10-CM | POA: Diagnosis not present

## 2021-12-15 ENCOUNTER — Telehealth: Payer: Self-pay | Admitting: *Deleted

## 2021-12-15 NOTE — Patient Outreach (Signed)
  Care Coordination   12/15/2021 Name: Alice Hughes MRN: [id number] DOB: [birth year]   Care Coordination Outreach Attempts:  A third unsuccessful outreach was attempted today to offer the patient with information about available care coordination services as a benefit of their health plan.   Follow Up Plan:  No further outreach attempts will be made at this time. We have been unable to contact the patient to offer or enroll patient in care coordination services  Encounter Outcome:  No Answer   Care Coordination Interventions:  No, not indicated    Valente David, RN, MSN, [facility] Care Management Care Management Coordinator [phone]

## 2022-04-19 DIAGNOSIS — E1142 Type 2 diabetes mellitus with diabetic polyneuropathy: Secondary | ICD-10-CM | POA: Diagnosis not present

## 2022-04-19 DIAGNOSIS — M0579 Rheumatoid arthritis with rheumatoid factor of multiple sites without organ or systems involvement: Secondary | ICD-10-CM | POA: Diagnosis not present

## 2022-04-19 DIAGNOSIS — Z7960 Long term (current) use of unspecified immunomodulators and immunosuppressants: Secondary | ICD-10-CM | POA: Diagnosis not present

## 2022-05-14 DIAGNOSIS — Z794 Long term (current) use of insulin: Secondary | ICD-10-CM | POA: Diagnosis not present

## 2022-05-14 DIAGNOSIS — I10 Essential (primary) hypertension: Secondary | ICD-10-CM | POA: Diagnosis not present

## 2022-05-14 DIAGNOSIS — E113293 Type 2 diabetes mellitus with mild nonproliferative diabetic retinopathy without macular edema, bilateral: Secondary | ICD-10-CM | POA: Diagnosis not present

## 2022-05-14 DIAGNOSIS — E1122 Type 2 diabetes mellitus with diabetic chronic kidney disease: Secondary | ICD-10-CM | POA: Diagnosis not present

## 2022-05-14 DIAGNOSIS — N1831 Chronic kidney disease, stage 3a: Secondary | ICD-10-CM | POA: Diagnosis not present

## 2022-07-26 DIAGNOSIS — Z79899 Other long term (current) drug therapy: Secondary | ICD-10-CM | POA: Diagnosis not present

## 2022-07-26 DIAGNOSIS — M1A0790 Idiopathic chronic gout, unspecified ankle and foot, without tophus (tophi): Secondary | ICD-10-CM | POA: Diagnosis not present

## 2022-07-26 DIAGNOSIS — M0579 Rheumatoid arthritis with rheumatoid factor of multiple sites without organ or systems involvement: Secondary | ICD-10-CM | POA: Diagnosis not present

## 2022-08-17 IMAGING — CT CT ABD-PELV W/O CM
2 of 4 series · 16 of 46 positions shown, 18 images · non-contrast
Comparison: CT abdomen and pelvis 02/23/2014

CLINICAL DATA: Left lower quadrant pain



[Series 3: ap without · axial · non-contrast · 0.96mm/px · z∈[+858,+1308]mm · 13 of 104 slices shown, 15 images]
[im 7/104  soft-tissue]
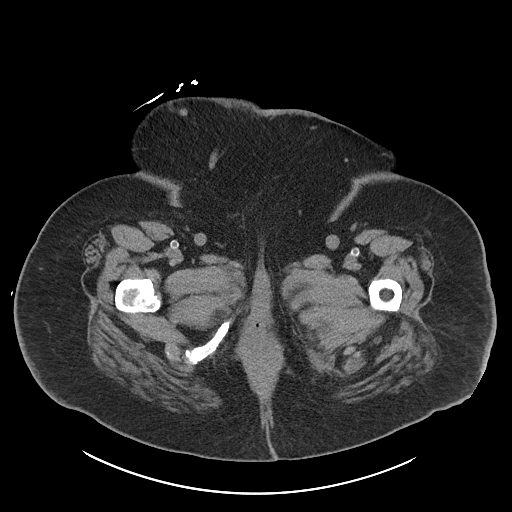
[im 7/104  bone]
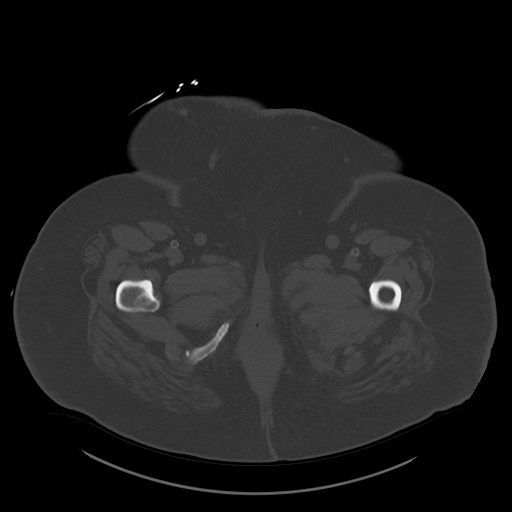
[im 13/104  soft-tissue]
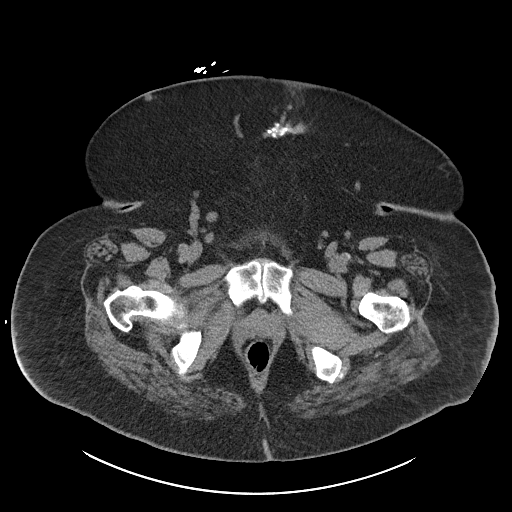
[im 20/104  soft-tissue]
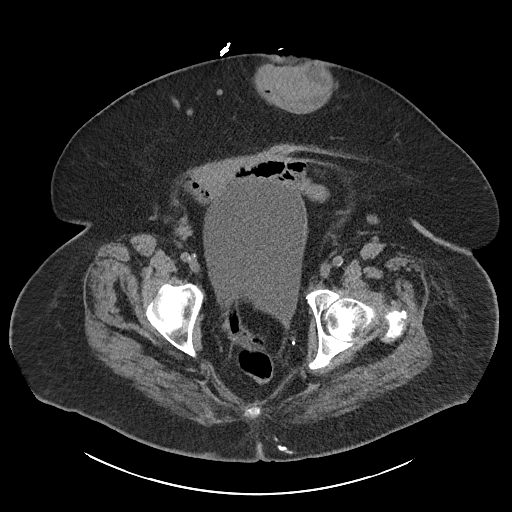
[im 33/104  soft-tissue]
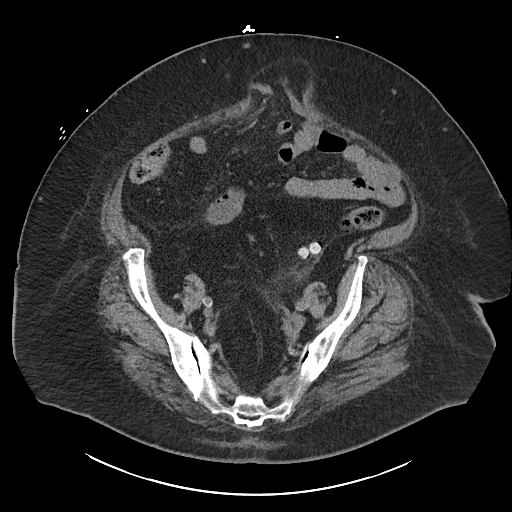
[im 39/104  soft-tissue]
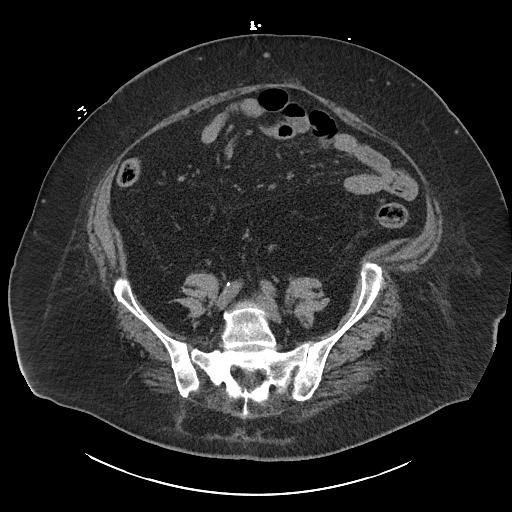
[im 46/104  soft-tissue]
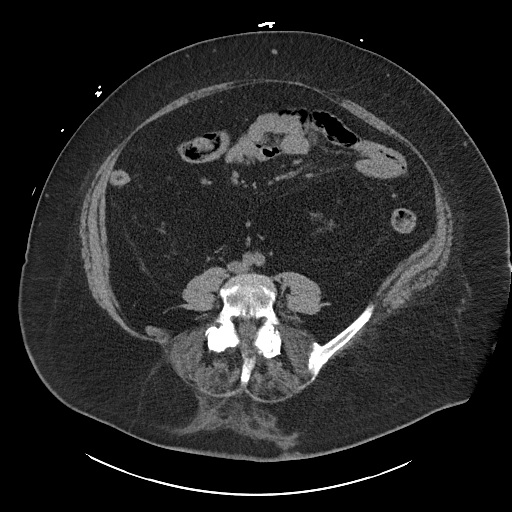
[im 52/104  soft-tissue]
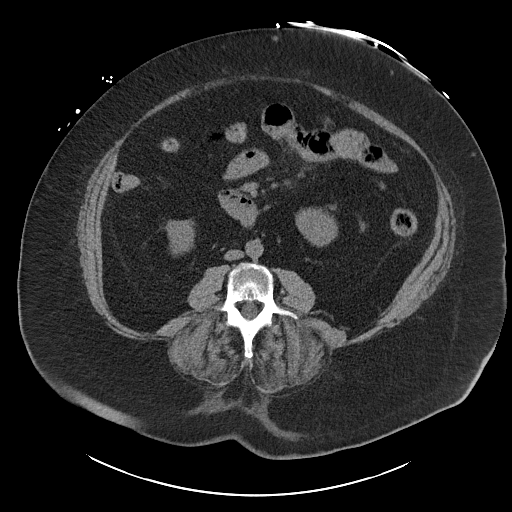
[im 58/104  soft-tissue]
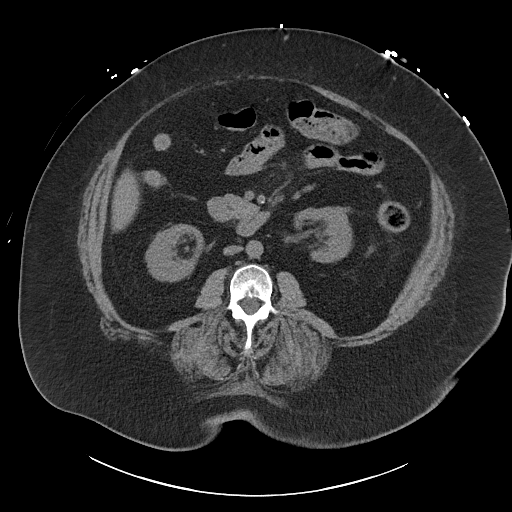
[im 65/104  soft-tissue]
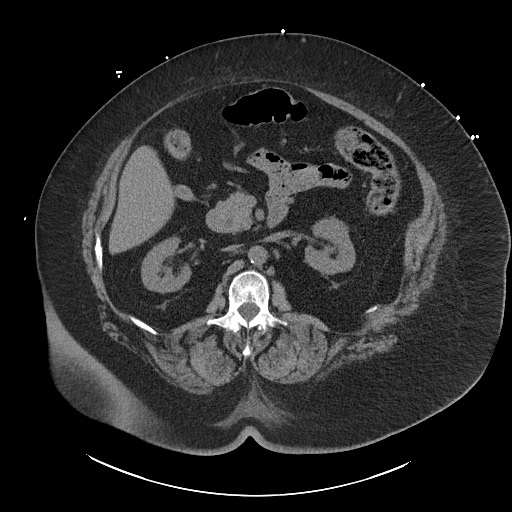
[im 65/104  bone]
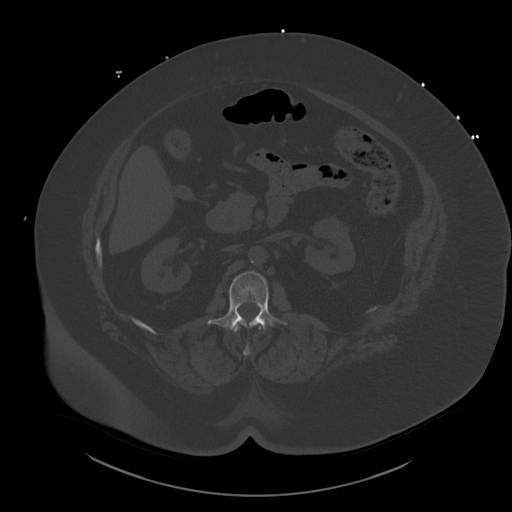
[im 71/104  soft-tissue]
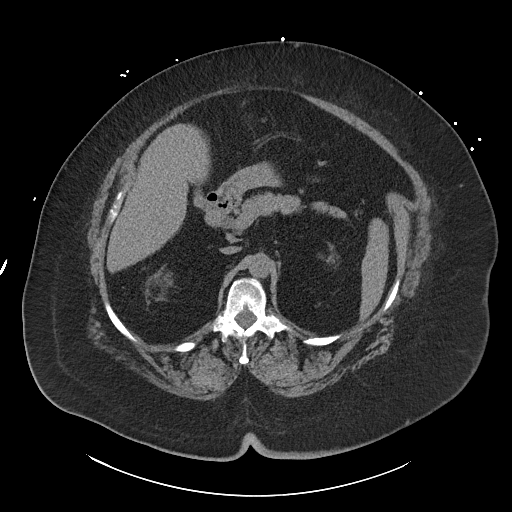
[im 84/104  soft-tissue]
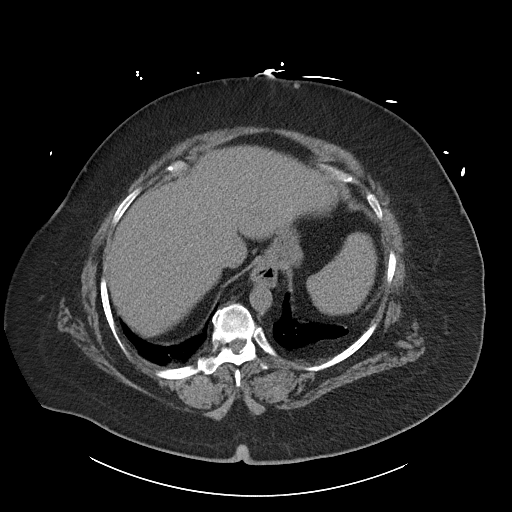
[im 91/104  soft-tissue]
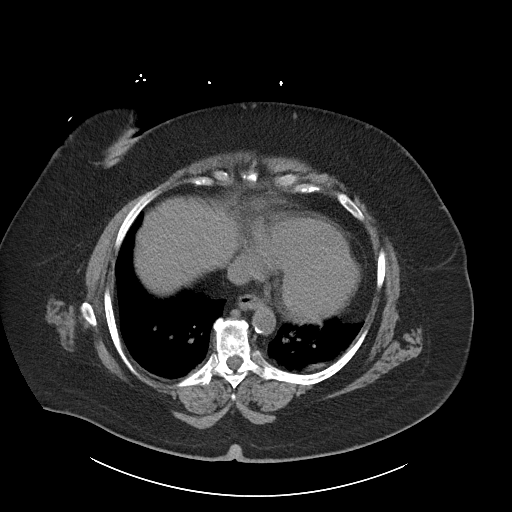
[im 97/104  soft-tissue]
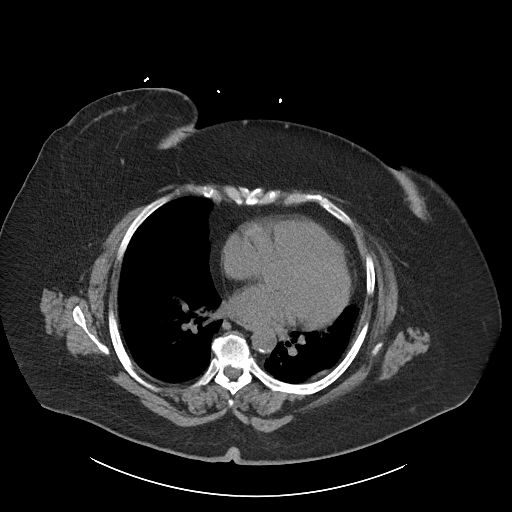

[Series 6: cor · coronal · 0.98mm/px · 3 of 143 slices shown]
[im 48/143  soft-tissue]
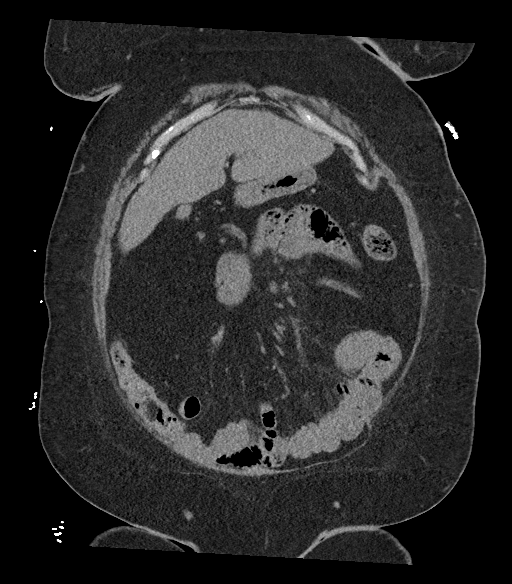
[im 64/143  soft-tissue]
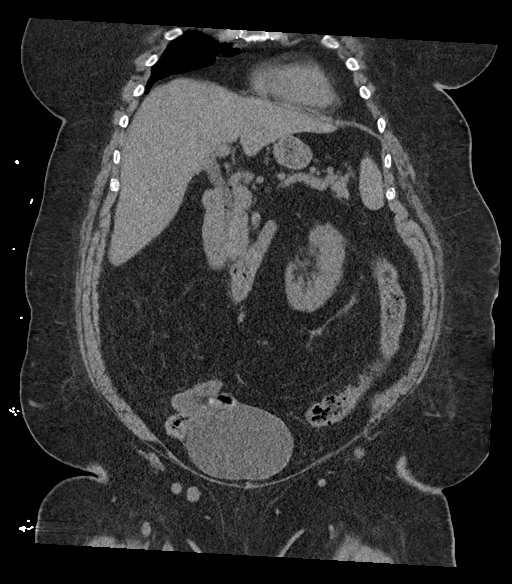
[im 79/143  soft-tissue]
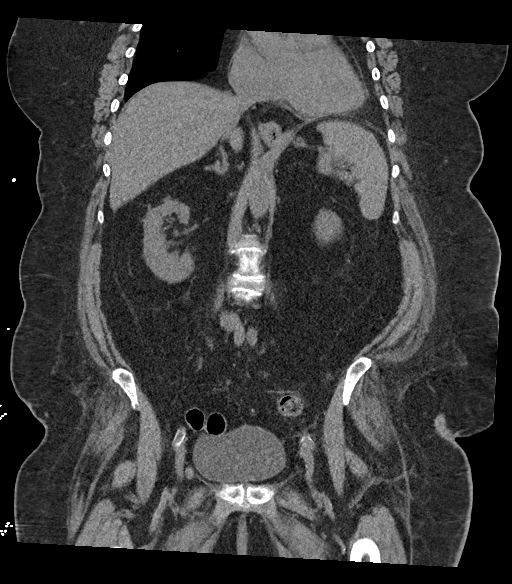

[16 of 46 positions shown; findings below may reference images not displayed]

FINDINGS: Lower chest: Subsegmental atelectatic changes in the lung bases.

Hepatobiliary: Liver is mildly enlarged measuring 18.9 cm in length.
No suspicious hepatic mass identified. Gallbladder is surgically
absent. No biliary ductal dilatation.

Pancreas: Unremarkable. No pancreatic ductal dilatation or
surrounding inflammatory changes.

Spleen: Normal in size without focal abnormality.

Adrenals/Urinary Tract: Adrenal glands are unremarkable. Kidneys are
normal, without renal calculi, focal lesion, or hydronephrosis.
Bladder is unremarkable.

Stomach/Bowel: Small hiatal hernia. No bowel obstruction, free air
or pneumatosis. Colonic diverticulosis. Mild fat stranding around
the proximal sigmoid colon in the left pelvis. Appendix not
visualized.

Vascular/Lymphatic: Aortic atherosclerosis. No enlarged abdominal or
pelvic lymph nodes.

Reproductive: Status post hysterectomy. No adnexal masses.

Other: Midline supraumbilical abdominal wall hernia which contains
fat and loops of small bowel. The hernia defect measures 5.4 x
cm. No ascites.

Musculoskeletal: No suspicious bony lesions identified.
IMPRESSION: 1. Findings likely represent mild acute diverticulitis in the
sigmoid colon.
2. Small hiatal hernia.
3. Ventral abdominal wall hernia containing fat and loops of bowel.

## 2022-10-15 DIAGNOSIS — R748 Abnormal levels of other serum enzymes: Secondary | ICD-10-CM | POA: Diagnosis not present

## 2022-10-15 DIAGNOSIS — R7989 Other specified abnormal findings of blood chemistry: Secondary | ICD-10-CM | POA: Diagnosis not present

## 2022-10-15 DIAGNOSIS — N1831 Chronic kidney disease, stage 3a: Secondary | ICD-10-CM | POA: Diagnosis not present

## 2022-10-15 DIAGNOSIS — Z794 Long term (current) use of insulin: Secondary | ICD-10-CM | POA: Diagnosis not present

## 2022-10-15 DIAGNOSIS — E119 Type 2 diabetes mellitus without complications: Secondary | ICD-10-CM | POA: Diagnosis not present

## 2022-10-15 DIAGNOSIS — I10 Essential (primary) hypertension: Secondary | ICD-10-CM | POA: Diagnosis not present

## 2022-10-17 DIAGNOSIS — R7889 Finding of other specified substances, not normally found in blood: Secondary | ICD-10-CM | POA: Diagnosis not present

## 2022-10-17 DIAGNOSIS — N261 Atrophy of kidney (terminal): Secondary | ICD-10-CM | POA: Diagnosis not present

## 2022-10-17 DIAGNOSIS — R748 Abnormal levels of other serum enzymes: Secondary | ICD-10-CM | POA: Diagnosis not present

## 2022-10-17 DIAGNOSIS — I10 Essential (primary) hypertension: Secondary | ICD-10-CM | POA: Diagnosis not present

## 2022-10-23 DIAGNOSIS — I10 Essential (primary) hypertension: Secondary | ICD-10-CM | POA: Diagnosis not present

## 2022-10-23 DIAGNOSIS — N1832 Chronic kidney disease, stage 3b: Secondary | ICD-10-CM | POA: Diagnosis not present

## 2022-10-23 DIAGNOSIS — R809 Proteinuria, unspecified: Secondary | ICD-10-CM | POA: Diagnosis not present

## 2022-10-23 DIAGNOSIS — E119 Type 2 diabetes mellitus without complications: Secondary | ICD-10-CM | POA: Diagnosis not present

## 2022-10-23 DIAGNOSIS — Z794 Long term (current) use of insulin: Secondary | ICD-10-CM | POA: Diagnosis not present

## 2022-11-15 DIAGNOSIS — E113293 Type 2 diabetes mellitus with mild nonproliferative diabetic retinopathy without macular edema, bilateral: Secondary | ICD-10-CM | POA: Diagnosis not present

## 2022-11-15 DIAGNOSIS — E1169 Type 2 diabetes mellitus with other specified complication: Secondary | ICD-10-CM | POA: Diagnosis not present

## 2022-11-15 DIAGNOSIS — E1122 Type 2 diabetes mellitus with diabetic chronic kidney disease: Secondary | ICD-10-CM | POA: Diagnosis not present

## 2022-11-15 DIAGNOSIS — Z794 Long term (current) use of insulin: Secondary | ICD-10-CM | POA: Diagnosis not present

## 2022-11-15 DIAGNOSIS — N1831 Chronic kidney disease, stage 3a: Secondary | ICD-10-CM | POA: Diagnosis not present

## 2022-11-15 DIAGNOSIS — I10 Essential (primary) hypertension: Secondary | ICD-10-CM | POA: Diagnosis not present

## 2022-11-26 DIAGNOSIS — M1A0790 Idiopathic chronic gout, unspecified ankle and foot, without tophus (tophi): Secondary | ICD-10-CM | POA: Diagnosis not present

## 2022-11-26 DIAGNOSIS — M0579 Rheumatoid arthritis with rheumatoid factor of multiple sites without organ or systems involvement: Secondary | ICD-10-CM | POA: Diagnosis not present

## 2022-11-26 DIAGNOSIS — Z79899 Other long term (current) drug therapy: Secondary | ICD-10-CM | POA: Diagnosis not present

## 2022-12-04 DIAGNOSIS — Z23 Encounter for immunization: Secondary | ICD-10-CM | POA: Diagnosis not present

## 2022-12-04 DIAGNOSIS — I5032 Chronic diastolic (congestive) heart failure: Secondary | ICD-10-CM | POA: Diagnosis not present

## 2022-12-04 DIAGNOSIS — E1122 Type 2 diabetes mellitus with diabetic chronic kidney disease: Secondary | ICD-10-CM | POA: Diagnosis not present

## 2022-12-04 DIAGNOSIS — E1140 Type 2 diabetes mellitus with diabetic neuropathy, unspecified: Secondary | ICD-10-CM | POA: Diagnosis not present

## 2022-12-04 DIAGNOSIS — Z0000 Encounter for general adult medical examination without abnormal findings: Secondary | ICD-10-CM | POA: Diagnosis not present

## 2022-12-04 DIAGNOSIS — E785 Hyperlipidemia, unspecified: Secondary | ICD-10-CM | POA: Diagnosis not present

## 2022-12-04 DIAGNOSIS — I10 Essential (primary) hypertension: Secondary | ICD-10-CM | POA: Diagnosis not present

## 2022-12-04 DIAGNOSIS — E1165 Type 2 diabetes mellitus with hyperglycemia: Secondary | ICD-10-CM | POA: Diagnosis not present

## 2023-02-06 ENCOUNTER — Inpatient Hospital Stay: Payer: 59 | Attending: Oncology | Admitting: Oncology

## 2023-02-06 ENCOUNTER — Encounter: Payer: Self-pay | Admitting: Oncology

## 2023-02-06 ENCOUNTER — Inpatient Hospital Stay: Payer: 59

## 2023-02-06 VITALS — Temp 97.3°F

## 2023-02-06 DIAGNOSIS — D631 Anemia in chronic kidney disease: Secondary | ICD-10-CM | POA: Diagnosis present

## 2023-02-06 DIAGNOSIS — M069 Rheumatoid arthritis, unspecified: Secondary | ICD-10-CM | POA: Diagnosis not present

## 2023-02-06 DIAGNOSIS — R778 Other specified abnormalities of plasma proteins: Secondary | ICD-10-CM

## 2023-02-06 DIAGNOSIS — D509 Iron deficiency anemia, unspecified: Secondary | ICD-10-CM | POA: Diagnosis not present

## 2023-02-06 DIAGNOSIS — N183 Chronic kidney disease, stage 3 unspecified: Secondary | ICD-10-CM | POA: Diagnosis not present

## 2023-02-06 DIAGNOSIS — H9190 Unspecified hearing loss, unspecified ear: Secondary | ICD-10-CM | POA: Insufficient documentation

## 2023-02-06 DIAGNOSIS — D539 Nutritional anemia, unspecified: Secondary | ICD-10-CM | POA: Insufficient documentation

## 2023-02-06 DIAGNOSIS — Z803 Family history of malignant neoplasm of breast: Secondary | ICD-10-CM | POA: Diagnosis not present

## 2023-02-06 DIAGNOSIS — Z79899 Other long term (current) drug therapy: Secondary | ICD-10-CM | POA: Insufficient documentation

## 2023-02-06 DIAGNOSIS — N1832 Chronic kidney disease, stage 3b: Secondary | ICD-10-CM

## 2023-02-06 HISTORY — DX: Anemia in chronic kidney disease: D63.1

## 2023-02-06 NOTE — Assessment & Plan Note (Signed)
 Encourage oral hydration and avoid nephrotoxins.  SPEP showed slight irregularity in the gamma region which may represent M protein.  I will repeat SPEP and check IFE at next visit.

## 2023-02-06 NOTE — Assessment & Plan Note (Addendum)
 Labs are reviewed and discussed with patient. Hb 10.9 ferritin 13.1, iron  saturation 9, TIBC 480    I discussed about option of continue oral iron  supplementation and repeat blood work for evaluation of treatment response.  If no significant improvement, then proceed with IV Venofer  treatments. Alternative option of proceed with IV Venofer  treatments. I discussed about the potential risks including but not limited to allergic reactions/infusion reactions including anaphylactic reactions, phlebitis, high blood pressure, wheezing, SOB, skin rash, weight gain,dark urine, leg swelling, back pain, headache, nausea and fatigue, etc. Patient agrees with IV Venofer  Plan IV venofer  weekly x 4   Etiology of IDA, ? GI blood loss refer to GI

## 2023-02-06 NOTE — Progress Notes (Signed)
 Pt here to establish care.

## 2023-02-06 NOTE — Assessment & Plan Note (Signed)
 There maybe a component of anemia due to CKD.  IV Venofer  to increase iron  store first.

## 2023-02-06 NOTE — Progress Notes (Addendum)
 Hematology/Oncology Progress note [phone] [phone]      Patient Care Team: Valora Agent, MD as PCP - General (Family Medicine) Babara Call, MD as Consulting Physician (Oncology)  REFERRING PROVIDER: Leavy Aloysius LABOR, MD  CHIEF COMPLAINTS/REASON FOR VISIT:  Evaluation of anemia   ASSESSMENT & PLAN:   Iron  deficiency anemia Labs are reviewed and discussed with patient. Hb 10.9 ferritin 13.1, iron  saturation 9, TIBC 480    I discussed about option of continue oral iron  supplementation and repeat blood work for evaluation of treatment response.  If no significant improvement, then proceed with IV Venofer  treatments. Alternative option of proceed with IV Venofer  treatments. I discussed about the potential risks including but not limited to allergic reactions/infusion reactions including anaphylactic reactions, phlebitis, high blood pressure, wheezing, SOB, skin rash, weight gain,dark urine, leg swelling, back pain, headache, nausea and fatigue, etc. Patient agrees with IV Venofer  Plan IV venofer  weekly x 4   Etiology of IDA, ? GI blood loss refer to GI   Anemia in chronic kidney disease (CKD) There maybe a component of anemia due to CKD.  IV Venofer  to increase iron  store first.   CKD (chronic kidney disease) stage 3, GFR 30-59 ml/min (HCC) Encourage oral hydration and avoid nephrotoxins.  SPEP showed slight irregularity in the gamma region which may represent M protein.  I will repeat SPEP and check IFE at next visit.   Macrocytic anemia Likely due to methotrexate due. Check B12, Folate   Orders Placed This Encounter  Procedures   Multiple Myeloma Panel (SPEP&IFE w/QIG)    Standing Status:   Future    Expected Date:   02/06/2023    Expiration Date:   02/06/2024   CBC with Differential/Platelet    Standing Status:   Future    Expected Date:   02/06/2023    Expiration Date:   02/06/2024   Retic Panel    Standing Status:   Future    Expected Date:    02/06/2023    Expiration Date:   02/06/2024   Kappa/lambda light chains    Standing Status:   Future    Expected Date:   02/06/2023    Expiration Date:   02/06/2024   Vitamin B12    Standing Status:   Future    Expected Date:   02/06/2023    Expiration Date:   02/06/2024   Folate    Standing Status:   Future    Expected Date:   02/06/2023    Expiration Date:   02/06/2024   Lactate dehydrogenase    Standing Status:   Future    Expected Date:   02/06/2023    Expiration Date:   02/06/2024   Haptoglobin    Standing Status:   Future    Expected Date:   02/06/2023    Expiration Date:   02/06/2024   Ambulatory referral to Social Work    Referral Priority:   Routine    Referral Type:   Consultation    Referral Reason:   Specialty Services Required    Number of Visits Requested:   1   Ambulatory referral to Gastroenterology    Referral Priority:   Routine    Referral Type:   Consultation    Referral Reason:   Specialty Services Required    Number of Visits Requested:   1    All questions were answered. The patient knows to call the clinic with any problems, questions or concerns.  Call Babara, MD, PhD [facility] Oncology 02/06/2023   HISTORY OF PRESENTING ILLNESS:   Alice Hughes is a  68 y.o.  female with PMH listed below was seen in consultation at the request of  Voora, Raven A, MD  for evaluation of anemia. She was seen by me once in 2021 and did not follow up   Patient has hearing impairment.  She was accompanied by her husband who also has hearing impairment. Online sign language interpreter service was utilized for the entire encounter.  She has history of chronic kidney disease-11/11/2019, Rheumatoid arthritis, on methotrexate.  She takes folic acid  daily.  Patient denies any unintentional weight loss, fever, night sweats.  She has good appetite.  She feels tired. Denies hematochezia, hematuria, hematemesis, epistaxis, black tarry stool. Not on anticoagulation.         Review  of Systems  Constitutional:  Positive for fatigue. Negative for appetite change, chills and fever.  HENT:   Negative for hearing loss and voice change.   Eyes:  Negative for eye problems.  Respiratory:  Negative for chest tightness and cough.   Cardiovascular:  Positive for leg swelling. Negative for chest pain.  Gastrointestinal:  Negative for abdominal distention, abdominal pain and blood in stool.  Endocrine: Negative for hot flashes.  Genitourinary:  Negative for difficulty urinating and frequency.   Musculoskeletal:  Negative for arthralgias.  Skin:  Negative for itching and rash.  Neurological:  Negative for extremity weakness.  Hematological:  Negative for adenopathy.  Psychiatric/Behavioral:  Negative for confusion.     MEDICAL HISTORY:  Past Medical History:  Diagnosis Date   Arthritis    Deaf    Diabetes mellitus without complication (HCC)    Gallstones    Heart failure (HCC)    Hypertension    Obesity    Rheumatoid arthritis (HCC)    Transverse myelitis (HCC)     SURGICAL HISTORY: Past Surgical History:  Procedure Laterality Date   ABDOMINAL HYSTERECTOMY     CHOLECYSTECTOMY     RIGHT HEART CATH AND CORONARY ANGIOGRAPHY Right 05/19/2019   Procedure: RIGHT HEART CATH;  Surgeon: Bosie Vinie LABOR, MD;  Location: [facility];  Service: Cardiovascular;  Laterality: Right;    SOCIAL HISTORY: Social History   Socioeconomic History   Marital status: Married    Spouse name: Not on file   Number of children: Not on file   Years of education: Not on file   Highest education level: Not on file  Occupational History   Not on file  Tobacco Use   Smoking status: Never   Smokeless tobacco: Never  Vaping Use   Vaping status: Never Used  Substance and Sexual Activity   Alcohol use: Not Currently   Drug use: Never   Sexual activity: Not on file  Other Topics Concern   Not on file  Social History Narrative   Not on file   Social Drivers of Health    Financial Resource Strain: Low Risk  (12/04/2022)   Received from [facility]   Overall Financial Resource Strain (CARDIA)    Difficulty of Paying Living Expenses: Not very hard  Food Insecurity: Food Insecurity Present (02/06/2023)   Hunger Vital Sign    Worried About Running Out of Food in the Last Year: Sometimes true    Ran Out of Food in the Last Year: Never true  Transportation Needs: No Transportation Needs (02/06/2023)   PRAPARE - Administrator, Civil Service (Medical): No  Lack of Transportation (Non-Medical): No  Physical Activity: Not on file  Stress: Not on file  Social Connections: Not on file  Intimate Partner Violence: Not At Risk (02/06/2023)   Humiliation, Afraid, Rape, and Kick questionnaire    Fear of Current or Ex-Partner: No    Emotionally Abused: No    Physically Abused: No    Sexually Abused: No    FAMILY HISTORY: Family History  Problem Relation Age of Onset   Diabetes Mother    Diabetes Father    Heart disease Father    Breast cancer Sister 49   Diabetes Brother     ALLERGIES:  is allergic to iodinated contrast media.  MEDICATIONS:  Current Outpatient Medications  Medication Sig Dispense Refill   colchicine 0.6 MG tablet Take 0.6 mg by mouth daily.     cyanocobalamin  (VITAMIN B12) 1000 MCG tablet Take 1,000 mcg by mouth daily.     Dulaglutide (TRULICITY) 0.75 MG/0.5ML SOAJ Inject 0.75 mg into the skin.     folic acid  (FOLVITE ) 1 MG tablet Take 1 mg by mouth daily.     furosemide (LASIX) 20 MG tablet Take 20 mg by mouth daily.     gabapentin (NEURONTIN) 300 MG capsule Take 300 mg by mouth 2 (two) times daily.     insulin  glargine (LANTUS) 100 UNIT/ML injection Inject 70 Units into the skin at bedtime.      JARDIANCE 10 MG TABS tablet Take 10 mg by mouth daily.     lisinopril (ZESTRIL) 10 MG tablet Take 20 mg by mouth daily.     metFORMIN (GLUCOPHAGE) 1000 MG tablet Take 1,000 mg by mouth 2 (two) times daily with a  meal.     methotrexate (RHEUMATREX) 2.5 MG tablet Take 15 mg by mouth every Friday.     NOVOLOG  FLEXPEN 100 UNIT/ML FlexPen Inject into the skin. 40 units in morning and 50units in evening     rosuvastatin (CRESTOR) 5 MG tablet Take 5 mg by mouth at bedtime.      HYDROcodone -acetaminophen  (NORCO) 5-325 MG tablet Take 1 tablet by mouth every 6 (six) hours as needed for moderate pain. (Patient not taking: Reported on 05/08/2019) 15 tablet 0   oxyCODONE -acetaminophen  (PERCOCET) 5-325 MG tablet Take 1 tablet by mouth every 4 (four) hours as needed for severe pain. (Patient not taking: Reported on 03/13/2021) 15 tablet 0   No current facility-administered medications for this visit.   Facility-Administered Medications Ordered in Other Visits  Medication Dose Route Frequency Provider Last Rate Last Admin   sodium chloride  flush (NS) 0.9 % injection 3 mL  3 mL Intravenous Q12H Bosie Vinie LABOR, MD         PHYSICAL EXAMINATION: ECOG PERFORMANCE STATUS: 2 - Symptomatic, <50% confined to bed Vitals:   02/06/23 1530  Temp: (!) 97.3 F (36.3 C)   There were no vitals filed for this visit.   Physical Exam Constitutional:      General: She is not in acute distress.    Appearance: She is obese.  HENT:     Head: Normocephalic and atraumatic.  Eyes:     General: No scleral icterus. Cardiovascular:     Rate and Rhythm: Normal rate and regular rhythm.     Heart sounds: Normal heart sounds.  Pulmonary:     Effort: Pulmonary effort is normal. No respiratory distress.     Breath sounds: No wheezing.  Abdominal:     General: Bowel sounds are normal. There is no distension.  Palpations: Abdomen is soft.  Musculoskeletal:        General: No deformity. Normal range of motion.     Cervical back: Normal range of motion and neck supple.  Skin:    General: Skin is warm and dry.     Findings: No erythema or rash.  Neurological:     Mental Status: She is alert. Mental status is at baseline.      Coordination: Coordination normal.     Comments: Chronic hearing impairment  Psychiatric:        Mood and Affect: Mood normal.     LABORATORY DATA:  I have reviewed the data as listed Lab Results  Component Value Date   WBC 10.8 (H) 03/13/2021   HGB 10.5 (L) 03/13/2021   HCT 34.7 (L) 03/13/2021   MCV 93.3 03/13/2021   PLT 286 03/13/2021   No results for input(s): NA, K, CL, CO2, GLUCOSE, BUN, CREATININE, CALCIUM, GFRNONAA, GFRAA, PROT, ALBUMIN, AST, ALT, ALKPHOS, BILITOT, BILIDIR, IBILI in the last 8760 hours. Iron /TIBC/Ferritin/ %Sat    Component Value Date/Time   IRON  42 12/11/2019 1549   TIBC 475 (H) 12/11/2019 1549   FERRITIN 24 12/11/2019 1549   IRONPCTSAT 9 (L) 12/11/2019 1549      RADIOGRAPHIC STUDIES: I have personally reviewed the radiological images as listed and agreed with the findings in the report. No results found.

## 2023-02-06 NOTE — Assessment & Plan Note (Signed)
 Likely due to methotrexate due. Check B12, Folate

## 2023-02-07 ENCOUNTER — Telehealth: Payer: Self-pay

## 2023-02-07 NOTE — Telephone Encounter (Signed)
 GI referral faxed to [facility] clinic. Re: IDA.  Fax confirmation received.

## 2023-02-15 ENCOUNTER — Other Ambulatory Visit: Payer: Self-pay | Admitting: Oncology

## 2023-02-15 ENCOUNTER — Inpatient Hospital Stay: Payer: 59

## 2023-02-15 VITALS — BP 114/83 | HR 72 | Temp 97.2°F | Resp 18

## 2023-02-15 DIAGNOSIS — D509 Iron deficiency anemia, unspecified: Secondary | ICD-10-CM | POA: Diagnosis not present

## 2023-02-15 DIAGNOSIS — D508 Other iron deficiency anemias: Secondary | ICD-10-CM

## 2023-02-15 MED ORDER — SODIUM CHLORIDE 0.9 % IV SOLN
INTRAVENOUS | Status: DC
  Filled 2023-02-15: qty 250

## 2023-02-15 MED ORDER — IRON SUCROSE 20 MG/ML IV SOLN
200.0000 mg | Freq: Once | INTRAVENOUS | Status: AC
  Administered 2023-02-15: 200 mg via INTRAVENOUS

## 2023-02-15 NOTE — Patient Instructions (Signed)

## 2023-02-15 NOTE — Progress Notes (Signed)
[facility] Clinical Social Work  Clinical Social Work was referred by medical provider for assessment of psychosocial needs.  Clinical Social Worker met with patient to offer support and assess for needs.  Met her husband, Iantha Fallen, briefly.  Patient was able to read lips and used sign language.  An interpreter was not present.  Based on her SDOH, provided her with the list of food banks, local resources and CSW contact information.       Dorothey Baseman, LCSW  Clinical Social Worker [facility]

## 2023-02-22 ENCOUNTER — Inpatient Hospital Stay: Payer: 59

## 2023-02-22 VITALS — BP 133/52 | HR 85 | Temp 97.2°F | Resp 18

## 2023-02-22 DIAGNOSIS — D508 Other iron deficiency anemias: Secondary | ICD-10-CM

## 2023-02-22 DIAGNOSIS — D509 Iron deficiency anemia, unspecified: Secondary | ICD-10-CM | POA: Diagnosis not present

## 2023-02-22 MED ORDER — IRON SUCROSE 20 MG/ML IV SOLN
200.0000 mg | Freq: Once | INTRAVENOUS | Status: AC
Start: 2023-02-22 — End: 2023-02-22
  Administered 2023-02-22: 200 mg via INTRAVENOUS

## 2023-03-01 ENCOUNTER — Inpatient Hospital Stay: Payer: 59

## 2023-03-01 VITALS — BP 137/54 | HR 79 | Temp 96.6°F | Resp 18

## 2023-03-01 DIAGNOSIS — D508 Other iron deficiency anemias: Secondary | ICD-10-CM

## 2023-03-01 DIAGNOSIS — D509 Iron deficiency anemia, unspecified: Secondary | ICD-10-CM | POA: Diagnosis not present

## 2023-03-01 MED ORDER — IRON SUCROSE 20 MG/ML IV SOLN
200.0000 mg | Freq: Once | INTRAVENOUS | Status: AC
  Administered 2023-03-01: 200 mg via INTRAVENOUS

## 2023-03-01 NOTE — Progress Notes (Signed)
 1559: Rt AC PIV infiltrated about 4cc into Venofer push. Venofer stopped, IV removed, warm compress applied. MD made aware via secure chat. Pt educated to apply warm compress every 2 hours tonight for 20 minutes at a time. And to apply barrier between heat and skin to avoid burning. Pt educated to call with any concerns or if any issues arise. Pt verbalizes understanding via interpreter.   New left fa PIV started and remainder of Venofer was given. Pt tolerated treatment well.

## 2023-03-08 ENCOUNTER — Inpatient Hospital Stay: Payer: 59 | Attending: Oncology

## 2023-03-08 VITALS — BP 138/58 | HR 79 | Temp 97.2°F | Resp 18

## 2023-03-08 DIAGNOSIS — D631 Anemia in chronic kidney disease: Secondary | ICD-10-CM | POA: Diagnosis present

## 2023-03-08 DIAGNOSIS — D509 Iron deficiency anemia, unspecified: Secondary | ICD-10-CM | POA: Diagnosis present

## 2023-03-08 DIAGNOSIS — D508 Other iron deficiency anemias: Secondary | ICD-10-CM

## 2023-03-08 DIAGNOSIS — N183 Chronic kidney disease, stage 3 unspecified: Secondary | ICD-10-CM | POA: Insufficient documentation

## 2023-03-08 MED ORDER — IRON SUCROSE 20 MG/ML IV SOLN
200.0000 mg | Freq: Once | INTRAVENOUS | Status: AC
  Administered 2023-03-08: 200 mg via INTRAVENOUS

## 2023-03-08 MED ORDER — SODIUM CHLORIDE 0.9% FLUSH
10.0000 mL | Freq: Once | INTRAVENOUS | Status: AC
  Administered 2023-03-08: 10 mL via INTRAVENOUS
  Filled 2023-03-08: qty 10

## 2023-04-26 ENCOUNTER — Encounter: Payer: Self-pay | Admitting: *Deleted

## 2023-05-07 ENCOUNTER — Encounter: Payer: Self-pay | Admitting: Anesthesiology

## 2023-05-07 ENCOUNTER — Emergency Department
Admission: EM | Admit: 2023-05-07 | Discharge: 2023-05-07 | Disposition: A | Discharge status: Home / Self Care | Attending: Emergency Medicine | Admitting: Emergency Medicine

## 2023-05-07 ENCOUNTER — Encounter
Admission: RE | Disposition: A | Discharge status: Home / Self Care | Payer: Self-pay | Source: Home / Self Care | Attending: Gastroenterology

## 2023-05-07 ENCOUNTER — Ambulatory Visit
Admission: RE | Admit: 2023-05-07 | Discharge: 2023-05-07 | Disposition: A | Discharge status: Home / Self Care | Attending: Gastroenterology | Admitting: Gastroenterology

## 2023-05-07 ENCOUNTER — Other Ambulatory Visit: Payer: Self-pay

## 2023-05-07 DIAGNOSIS — E1165 Type 2 diabetes mellitus with hyperglycemia: Secondary | ICD-10-CM | POA: Diagnosis not present

## 2023-05-07 DIAGNOSIS — E669 Obesity, unspecified: Secondary | ICD-10-CM | POA: Diagnosis not present

## 2023-05-07 DIAGNOSIS — Z538 Procedure and treatment not carried out for other reasons: Secondary | ICD-10-CM | POA: Insufficient documentation

## 2023-05-07 DIAGNOSIS — I509 Heart failure, unspecified: Secondary | ICD-10-CM | POA: Insufficient documentation

## 2023-05-07 DIAGNOSIS — D509 Iron deficiency anemia, unspecified: Secondary | ICD-10-CM | POA: Insufficient documentation

## 2023-05-07 DIAGNOSIS — R739 Hyperglycemia, unspecified: Secondary | ICD-10-CM | POA: Diagnosis present

## 2023-05-07 DIAGNOSIS — Z794 Long term (current) use of insulin: Secondary | ICD-10-CM | POA: Insufficient documentation

## 2023-05-07 DIAGNOSIS — R8290 Unspecified abnormal findings in urine: Secondary | ICD-10-CM | POA: Diagnosis not present

## 2023-05-07 HISTORY — DX: Venous insufficiency (chronic) (peripheral): I87.2

## 2023-05-07 HISTORY — PX: COLONOSCOPY: SHX5424

## 2023-05-07 HISTORY — PX: ESOPHAGOGASTRODUODENOSCOPY: SHX5428

## 2023-05-07 HISTORY — DX: Hyperlipidemia, unspecified: E78.5

## 2023-05-07 HISTORY — DX: Lymphedema, not elsewhere classified: I89.0

## 2023-05-07 HISTORY — DX: Pulmonary hypertension, unspecified: I27.20

## 2023-05-07 HISTORY — DX: Unspecified hearing loss, bilateral: H91.93

## 2023-05-07 HISTORY — DX: Polyosteoarthritis, unspecified: M15.9

## 2023-05-07 HISTORY — DX: Chronic kidney disease, unspecified: N18.9

## 2023-05-07 HISTORY — DX: Unspecified diastolic (congestive) heart failure: I50.30

## 2023-05-07 HISTORY — DX: Unspecified background retinopathy: H35.00

## 2023-05-07 HISTORY — DX: Chronic kidney disease, stage 3 unspecified: N18.30

## 2023-05-07 HISTORY — DX: Idiopathic chronic gout, unspecified ankle and foot, without tophus (tophi): M1A.0790

## 2023-05-07 LAB — CBC
HCT: 35.2 % — ABNORMAL LOW (ref 36.0–46.0)
Hemoglobin: 11.4 g/dL — ABNORMAL LOW (ref 12.0–15.0)
MCH: 33.5 pg (ref 26.0–34.0)
MCHC: 32.4 g/dL (ref 30.0–36.0)
MCV: 103.5 fL — ABNORMAL HIGH (ref 80.0–100.0)
Platelets: 213 10*3/uL (ref 150–400)
RBC: 3.4 MIL/uL — ABNORMAL LOW (ref 3.87–5.11)
RDW: 15.9 % — ABNORMAL HIGH (ref 11.5–15.5)
WBC: 8.4 10*3/uL (ref 4.0–10.5)
nRBC: 0 % (ref 0.0–0.2)

## 2023-05-07 LAB — BASIC METABOLIC PANEL WITH GFR
Anion gap: 12 (ref 5–15)
BUN: 32 mg/dL — ABNORMAL HIGH (ref 8–23)
CO2: 21 mmol/L — ABNORMAL LOW (ref 22–32)
Calcium: 9.3 mg/dL (ref 8.9–10.3)
Chloride: 105 mmol/L (ref 98–111)
Creatinine, Ser: 1.6 mg/dL — ABNORMAL HIGH (ref 0.44–1.00)
GFR, Estimated: 35 mL/min — ABNORMAL LOW (ref >60)
Glucose, Bld: 457 mg/dL — ABNORMAL HIGH (ref 70–99)
Potassium: 4.4 mmol/L (ref 3.5–5.1)
Sodium: 138 mmol/L (ref 135–145)

## 2023-05-07 LAB — URINALYSIS, ROUTINE W REFLEX MICROSCOPIC
Bilirubin Urine: NEGATIVE
Glucose, UA: >=500 mg/dL — AB
Ketones, ur: 5 mg/dL — AB
Nitrite: NEGATIVE
Protein, ur: 30 mg/dL — AB
Specific Gravity, Urine: 1.018 (ref 1.005–1.030)
Squamous Epithelial / HPF: 0 /HPF (ref 0–5)
WBC, UA: >50 WBC/hpf (ref 0–5)
pH: 5 (ref 5.0–8.0)

## 2023-05-07 LAB — CBG MONITORING, ED
Glucose-Capillary: 472 mg/dL — ABNORMAL HIGH (ref 70–99)
Glucose-Capillary: 397 mg/dL — ABNORMAL HIGH (ref 70–99)
Glucose-Capillary: 359 mg/dL — ABNORMAL HIGH (ref 70–99)
Glucose-Capillary: 308 mg/dL — ABNORMAL HIGH (ref 70–99)
Glucose-Capillary: 297 mg/dL — ABNORMAL HIGH (ref 70–99)
Glucose-Capillary: 308 mg/dL — ABNORMAL HIGH (ref 70–99)

## 2023-05-07 LAB — GLUCOSE, CAPILLARY: Glucose-Capillary: 449 mg/dL — ABNORMAL HIGH (ref 70–99)

## 2023-05-07 SURGERY — COLONOSCOPY
Anesthesia: General

## 2023-05-07 MED ORDER — INSULIN ASPART 100 UNIT/ML IJ SOLN
15.0000 [IU] | Freq: Once | INTRAMUSCULAR | Status: AC
  Administered 2023-05-07: 15 [IU] via INTRAVENOUS
  Filled 2023-05-07: qty 1

## 2023-05-07 MED ORDER — FOSFOMYCIN TROMETHAMINE 3 G PO PACK
3.0000 g | PACK | ORAL | Status: AC
  Administered 2023-05-07: 3 g via ORAL
  Filled 2023-05-07: qty 3

## 2023-05-07 MED ORDER — SODIUM CHLORIDE 0.9 % IV SOLN: INTRAVENOUS | Status: DC

## 2023-05-07 MED ORDER — SODIUM CHLORIDE 0.9 % IV BOLUS
500.0000 mL | Freq: Once | INTRAVENOUS | Status: AC
  Administered 2023-05-07: 500 mL via INTRAVENOUS

## 2023-05-07 NOTE — OR Nursing (Addendum)
 Unable to perform procedure, FSBS 449, notified Anest. Dr Jenise Mixer and Dr Emerick Hanlon; pt stated via ASL, Adrienne, held all meds and insulin with exception of Lisinopril since 04/27/23...to be eval in emeg. dept

## 2023-05-07 NOTE — Inpatient Diabetes Management (Signed)
 Inpatient Diabetes Program Recommendations  AACE/ADA: New Consensus Statement on Inpatient Glycemic Control   Target Ranges:  Prepandial:   less than 140 mg/dL      Peak postprandial:   less than 180 mg/dL (1-2 hours)      Critically ill patients:  140 - 180 mg/dL    Latest Reference Range & Units 05/07/23 09:34 05/07/23 10:18 05/07/23 13:07  Glucose-Capillary 70 - 99 mg/dL 478 (H) 295 (H) 621 (H)    Review of Glycemic Control  Diabetes history: DM2 Outpatient Diabetes medications: Lantus 70 units at bedtime, Humalog 40 units QAM, Humalog 50 units QPM, Metformin 1000 mg BID, Jardiance 5 mg daily, Trulicity 0.75 mg Qweek Current orders for Inpatient glycemic control: None; in ED  NOTE: Dr. Valetta Gaudy reached out about patient that was sent to ED today after colonoscopy was canceled due to hyperglycemia. Due to a misunderstanding, patient had stopped taking all DM medications on May 1st. Patient is deaf and used onsite sign language interpreter to communicate with patient. Patient does not understand a lot about the medications she takes, what they are or how they work for DM control. Patient reports that when she went to her preop appointment she was told to stop all DM medications on May 1st. Patient reports that her glucose was high today because she was told to eat regular jello. Explained to patient that her glucose was 449 mg/dl today due to her stopping DM medications, not from eating regular jello. Patient confirms she normally takes Lantus 70 units, Humalog 40 units with breakfast, Humalog 50 units with supper, Metformin 1000 mg BID, Jardiance 5 mg daily, and Trulicity 0.75 mg Qweek (Saturday). Patient reports that Dr. Lorelei Rogers had told her to take Lantus 35 units before her procedure but she did not take it because at preop appointment she was told to stop all DM meds on May 1st. Patient states that at her preop appointment they used an ipad interpreter and she does not understand them sometimes.  Patient states she needs an in person sign language interpreter to have a clear understanding. Asked patient to let providers know that she wants an in person interpreter at her appointments to make sure she has all questions answered and has a clear understanding of what she needs to do. Patient confirms that she sees Dr. Lorelei Rogers and she restarted on Trulicity after her appointment on 02/28/23 with Dr. Lorelei Rogers (had stopped it last year due to cost when she was in the doughnut hole).  Discussed each DM medication she is prescribed as an outpatient and how each medication works. She did not realize that Lantus and Humalog were insulins.  Discussed side effects of DM medications she is prescribed.  Patient reports that she has severe diarrhea to the point that she can not go out in public due to the diarrhea; patient reports this has been an issue for years due to Metformin. Patient reports when she has to travel she stops taking the Metformin for several days so that she does not have frequent diarrhea or loose of bowels. Explained that if Metformin is causing that severe of an issue with diarrhea that she likely needs to stop it and it would be recommended that it be stopped at discharge from the ED. Discussed that Trulicity can also cause GI upset (nausea, vomiting, diarrhea) but patient states she has noticed any changes with the diarrhea frequency she was already having since restarting the Trulicity.  Patient reports she does finger sticks for glucose  monitoring and glucose is usually 60's-low 100's mg/dl. Patient did not realize that less than 70 mg/dl was hypoglycemia.  Discussed that since she restarted taking the Trulicity, the Lantus or Humalog may need to be adjusted if she is having CBGs less than 70 mg/dl frequently. Asked that she reach out to Dr. Arnette Bias office if she continues to have any CBGs less than 70 mg/dl. Discussed glucose goals of 80-130 mg/dl. Discussed that when she has her colonoscopy procedure  rescheduled, that she will need to ask for an in person sign language interpreter and given clear directions about each medication she takes outpatient. Informed patient that an outpatient DM education consult would be placed and she would be contacted by [facility] outpatient to get something set up and it would be noted she needed an in person interpreter.  Encouraged patient to resume all her prior DM medications as she had been taking prior to May 1st except it would be recommended that Metformin be stopped since it is interfering with her ability to leave her house and go out in public.  Told patient to pay attention to discharge instructions as to whether the Metformin is stopped or not by the doctor.  Patient appreciative of information discussed and reports that she learned a lot of things she never knew. Spoke with Dr. Valetta Gaudy and placed order for outpatient DM education.  Thanks, Beacher Limerick, RN, MSN, CDCES Diabetes Coordinator Inpatient Diabetes Program [phone] (Team Pager from 8am to 5pm)

## 2023-05-07 NOTE — ED Triage Notes (Signed)
 Pt comes in via pov with complaints of hyperglycemia. Pt states that she was supposed to have and endoscopy done today. When she checked in her blood sugar was high, they cancelled the procedure and sent her to be seen here. Pt's BGC was 449 with endo. PT  has no complaints of pain. Pt states that she stopped her humaog and lantis on May 1st. When she came  for her consult, she stopped taking her medication. Pt admits that she didn't have full understanding of the instructions about not taking medications prior to her procedure. Pt feels weakness at tired at this time. Pt's glucose is 472 at this time. Pt is deaf, and has an interpreter with her in triage. Pt alert and oriented x4 with no signs of acute distress at this time.

## 2023-05-07 NOTE — Discharge Instructions (Addendum)
 Please resume your normal medications except STOP your metformin (which may be leading to your diarrhea.)  Please return to the ER right away if you begin feeling fatigued, extremely thirsty, weak or lightheaded, confused, or other concerns or symptoms arise.  Please call endocrinology tomorrow to schedule a follow-up appointment.

## 2023-05-07 NOTE — ED Provider Notes (Signed)
 [facility] Provider Note    Event Date/Time   First MD Initiated Contact with Patient 05/07/23 1129     (approximate)   History   Hyperglycemia   HPI  Alice Hughes is a 68 y.o. female   history of type 2 diabetes managed with insulin and tirzepatide.  Patient reports she was post have a scheduled endoscopy today.  She did the prep last night.  Everything seemed to be going well she went to endoscopy and they checked her blood sugar before the procedure and found it was high around 400 resulting in referring her to the ER  Bedside sign language interpreter utilized throughout history taking exam and follow-up  Patient reports no symptoms.  She reports she is hungry would like something to drink as she has not anything to eat or drink since last night.   She is not in any pain or discomfort.  She stopped taking all of her injectables a week ago including her insulins.  Verified her dosing and she reports that she was cut back to 35 units of long-acting and uses anywhere from 20 to 40 units of short acting        Physical Exam   Triage Vital Signs: ED Triage Vitals  Encounter Vitals Group     BP 05/07/23 1018 134/64     Systolic BP Percentile --      Diastolic BP Percentile --      Pulse Rate 05/07/23 1018 95     Resp 05/07/23 1018 18     Temp 05/07/23 1018 97.7 F (36.5 C)     Temp Source 05/07/23 1448 Oral     SpO2 05/07/23 1018 95 %     Weight 05/07/23 1019 286 lb (129.7 kg)     Height 05/07/23 1019 5\' 5"  (1.651 m)     Head Circumference --      Peak Flow --      Pain Score 05/07/23 1019 0     Pain Loc --      Pain Education --      Exclude from Growth Chart --     Most recent vital signs: Vitals:   05/07/23 1400 05/07/23 1448  BP: 123/80   Pulse: 82   Resp: 18   Temp:  97.8 F (36.6 C)  SpO2: 99%      General: Awake, no distress.  Very pleasant in no distress accompanied by her husband CV:  Good peripheral perfusion.   Normal tones and rate Resp:  Normal effort.  Clear lungs bilateral Abd:  No distention.  Soft nontender nondistended Other:  Alert well-oriented communicates with sign language well without difficulty via the interpreter  Mucous membranes slightly dry   ED Results / Procedures / Treatments   Labs (all labs ordered are listed, but only abnormal results are displayed) Labs Reviewed  BASIC METABOLIC PANEL WITH GFR - Abnormal; Notable for the following components:      Result Value   CO2 21 (*)    Glucose, Bld 457 (*)    BUN 32 (*)    Creatinine, Ser 1.60 (*)    GFR, Estimated 35 (*)    All other components within normal limits  CBC - Abnormal; Notable for the following components:   RBC 3.40 (*)    Hemoglobin 11.4 (*)    HCT 35.2 (*)    MCV 103.5 (*)    RDW 15.9 (*)    All other components within normal limits  URINALYSIS, ROUTINE W REFLEX MICROSCOPIC - Abnormal; Notable for the following components:   Color, Urine YELLOW (*)    APPearance CLOUDY (*)    Glucose, UA >=500 (*)    Hgb urine dipstick SMALL (*)    Ketones, ur 5 (*)    Protein, ur 30 (*)    Leukocytes,Ua MODERATE (*)    Bacteria, UA MANY (*)    All other components within normal limits  CBG MONITORING, ED - Abnormal; Notable for the following components:   Glucose-Capillary 472 (*)    All other components within normal limits  CBG MONITORING, ED - Abnormal; Notable for the following components:   Glucose-Capillary 397 (*)    All other components within normal limits  CBG MONITORING, ED - Abnormal; Notable for the following components:   Glucose-Capillary 359 (*)    All other components within normal limits  URINE CULTURE  CBG MONITORING, ED  CBG MONITORING, ED  CBG MONITORING, ED  CBG MONITORING, ED  CBG MONITORING, ED  CBG MONITORING, ED  CBG MONITORING, ED  CBG MONITORING, ED  CBG MONITORING, ED  CBG MONITORING, ED  CBG MONITORING, ED  CBG MONITORING, ED  CBG MONITORING, ED  CBG MONITORING, ED   CBG MONITORING, ED   Test results reviewed notable for chronic renal disease in keeping with previous baseline when compared to labs in Care Everywhere.  Mild chronic anemia  No frank symptoms of obvious urinary tract infection but her urine sample she debrided is clean and shows many bacteria.  Suspicious for UTI.  Will treat with single dose fosfomycin no clinical signs or symptoms suggestive of sepsis or pyelonephritis  EKG  RADIOLOGY     PROCEDURES:  Critical Care performed: No  Procedures   MEDICATIONS ORDERED IN ED: Medications  sodium chloride  0.9 % bolus 500 mL (0 mLs Intravenous Stopped 05/07/23 1330)  insulin aspart (novoLOG) injection 15 Units (15 Units Intravenous Given 05/07/23 1236)  fosfomycin (MONUROL) packet 3 g (3 g Oral Given 05/07/23 1350)  insulin aspart (novoLOG) injection 15 Units (15 Units Intravenous Given 05/07/23 1507)     IMPRESSION / MDM / ASSESSMENT AND PLAN / ED COURSE  I reviewed the triage vital signs and the nursing notes.                              Differential diagnosis includes, but is not limited to, hyperglycemia due to not having used insulin, dehydration, urinary tract infection, stress response, etc.  Based on her clinical history and what appears to be a miscommunication between her primary care team and the patient she has not been using her long-acting or short acting insulin for about the last 6 days.  Diabetes educator seen evaluate and console.  She has no evidence of support acute DKA, severe dehydration, HHS, etc.  CO2 21 with only small amount of ketones in her urine, symptoms in a type II diabetic do not appear to be consistent with DKA  Will give corrective insulin and IV fluids.  Does carry history of CHF thus 500 mL ordered  Patient's presentation is most consistent with acute complicated illness / injury requiring diagnostic workup.   Patient agreeable with plan to give insulin for blood glucose control.  Plan is to  discharge once glucose has improved to normal range the range of approximately 300 or less.  Patient agreeable with this plan.  Been counseled.  Diabetes educator with the plan to resume  her normal previously prescribed medications and follow-up closely with Dr. Nidia Barrows  Ongoing care including follow-up on reassessment of glucose and evaluation after next glucose check Dr. Synetta Eves.  An additional 15 units of insulin has been previously ordered       FINAL CLINICAL IMPRESSION(S) / ED DIAGNOSES   Final diagnoses:  Type 2 diabetes mellitus with obesity (HCC)     Rx / DC Orders   ED Discharge Orders          Ordered    Ambulatory referral to Nutrition and Diabetic Education       Comments: Pt is deaf and needs an on site sign language interpreter. She reports she does not get a clear understanding from ipad interpreter.  She has huge gaps in knowledge regarding DM and her medications. She was scheduled for colonoscopy today but it had to be canceled due to misunderstanding of instructions and patient had stopped all DM medications 5 days prior to procedure and ended up being sent to the ED with hyperglycemia.   05/07/23 1413             Note:  This document was prepared using Dragon voice recognition software and may include unintentional dictation errors.   Iver Marker, MD 05/07/23 1600

## 2023-05-07 NOTE — ED Triage Notes (Signed)
 First Nurse Note: Patient to ED from same day. Pt was scheduled for colonoscopy this AM and found to have a blood sugar in the 400's. Pt reports not taking meds x1 week due to miscommunication because pt thought she was suppose to stop them. Pt is hearing impaired.

## 2023-05-07 NOTE — ED Provider Notes (Signed)
 Care of this patient assumed from prior physician at 1500 pending reassessment of glucose and disposition. Please see prior physician note for further details.  Briefly this is a 68 year old female with history of T2DM who has not been taking her insulin for the past 5 days due to confusion on instructions prior to endoscopy.  Preprocedural blood glucose was elevated in the 400s, so she was directed to the ER.  No symptoms here.  Labs with stable anemia and renal dysfunction.  Slightly low bicarb, but normal anion gap, not consistent with DKA.  Urine questionable for infection, received fosfomycin.  Received insulin and fluids here.  Signed out to me pending reevaluation of her blood sugar, when under 300, it was felt the patient would likely be stable for discharge to resume her normal diabetes regimen, though it was recommended she hold metformin in the setting of diarrhea.  After small bolus of additional fluids, patient did have improvement in her blood glucose to 297. No new reported complaints. Patient discharged in stable condition.   Claria Crofts, MD 05/07/23 [phone]

## 2023-05-08 LAB — URINE CULTURE

## 2023-05-10 ENCOUNTER — Inpatient Hospital Stay: Payer: 59 | Attending: Oncology

## 2023-05-10 DIAGNOSIS — R778 Other specified abnormalities of plasma proteins: Secondary | ICD-10-CM

## 2023-05-10 DIAGNOSIS — N183 Chronic kidney disease, stage 3 unspecified: Secondary | ICD-10-CM | POA: Diagnosis present

## 2023-05-10 DIAGNOSIS — D631 Anemia in chronic kidney disease: Secondary | ICD-10-CM

## 2023-05-10 DIAGNOSIS — Z79899 Other long term (current) drug therapy: Secondary | ICD-10-CM | POA: Insufficient documentation

## 2023-05-10 DIAGNOSIS — N1832 Chronic kidney disease, stage 3b: Secondary | ICD-10-CM

## 2023-05-10 DIAGNOSIS — D509 Iron deficiency anemia, unspecified: Secondary | ICD-10-CM

## 2023-05-10 DIAGNOSIS — D539 Nutritional anemia, unspecified: Secondary | ICD-10-CM

## 2023-05-10 LAB — RETIC PANEL
Immature Retic Fract: 28.7 % — ABNORMAL HIGH (ref 2.3–15.9)
RBC.: 3.32 MIL/uL — ABNORMAL LOW (ref 3.87–5.11)
Retic Count, Absolute: 120.2 10*3/uL (ref 19.0–186.0)
Retic Ct Pct: 3.6 % — ABNORMAL HIGH (ref 0.4–3.1)
Reticulocyte Hemoglobin: 34.1 pg (ref >27.9)

## 2023-05-10 LAB — CBC WITH DIFFERENTIAL/PLATELET
Abs Immature Granulocytes: 0.06 10*3/uL (ref 0.00–0.07)
Basophils Absolute: 0.1 10*3/uL (ref 0.0–0.1)
Basophils Relative: 1 %
Eosinophils Absolute: 0.4 10*3/uL (ref 0.0–0.5)
Eosinophils Relative: 6 %
HCT: 34.5 % — ABNORMAL LOW (ref 36.0–46.0)
Hemoglobin: 11.2 g/dL — ABNORMAL LOW (ref 12.0–15.0)
Immature Granulocytes: 1 %
Lymphocytes Relative: 30 %
Lymphs Abs: 2.4 10*3/uL (ref 0.7–4.0)
MCH: 34 pg (ref 26.0–34.0)
MCHC: 32.5 g/dL (ref 30.0–36.0)
MCV: 104.9 fL — ABNORMAL HIGH (ref 80.0–100.0)
Monocytes Absolute: 0.6 10*3/uL (ref 0.1–1.0)
Monocytes Relative: 7 %
Neutro Abs: 4.4 10*3/uL (ref 1.7–7.7)
Neutrophils Relative %: 55 %
Platelets: 200 10*3/uL (ref 150–400)
RBC: 3.29 MIL/uL — ABNORMAL LOW (ref 3.87–5.11)
RDW: 16.6 % — ABNORMAL HIGH (ref 11.5–15.5)
WBC: 7.9 10*3/uL (ref 4.0–10.5)
nRBC: 0 % (ref 0.0–0.2)

## 2023-05-10 LAB — IRON AND TIBC
Iron: 70 ug/dL (ref 28–170)
Saturation Ratios: 18 % (ref 10.4–31.8)
TIBC: 388 ug/dL (ref 250–450)
UIBC: 318 ug/dL

## 2023-05-10 LAB — FERRITIN: Ferritin: 120 ng/mL (ref 11–307)

## 2023-05-10 LAB — FOLATE: Folate: >40 ng/mL (ref >5.9)

## 2023-05-10 LAB — VITAMIN B12: Vitamin B-12: 1170 pg/mL — ABNORMAL HIGH (ref 180–914)

## 2023-05-11 LAB — KAPPA/LAMBDA LIGHT CHAINS
Kappa free light chain: 68.4 mg/L — ABNORMAL HIGH (ref 3.3–19.4)
Kappa, lambda light chain ratio: 1.35 (ref 0.26–1.65)
Lambda free light chains: 50.8 mg/L — ABNORMAL HIGH (ref 5.7–26.3)

## 2023-05-20 ENCOUNTER — Encounter: Payer: Self-pay | Admitting: Oncology

## 2023-05-20 ENCOUNTER — Inpatient Hospital Stay: Payer: 59

## 2023-05-20 ENCOUNTER — Inpatient Hospital Stay: Payer: 59 | Admitting: Oncology

## 2023-05-20 VITALS — BP 137/54 | HR 80 | Temp 98.3°F | Resp 18

## 2023-05-20 DIAGNOSIS — N1832 Chronic kidney disease, stage 3b: Secondary | ICD-10-CM

## 2023-05-20 DIAGNOSIS — N183 Chronic kidney disease, stage 3 unspecified: Secondary | ICD-10-CM

## 2023-05-20 DIAGNOSIS — D509 Iron deficiency anemia, unspecified: Secondary | ICD-10-CM | POA: Diagnosis not present

## 2023-05-20 DIAGNOSIS — D631 Anemia in chronic kidney disease: Secondary | ICD-10-CM

## 2023-05-20 DIAGNOSIS — D508 Other iron deficiency anemias: Secondary | ICD-10-CM

## 2023-05-20 LAB — MULTIPLE MYELOMA PANEL, SERUM
Albumin SerPl Elph-Mcnc: 3.4 g/dL (ref 2.9–4.4)
Albumin/Glob SerPl: 1 (ref 0.7–1.7)
Alpha 1: 0.2 g/dL (ref 0.0–0.4)
Alpha2 Glob SerPl Elph-Mcnc: 0.9 g/dL (ref 0.4–1.0)
B-Globulin SerPl Elph-Mcnc: 1.1 g/dL (ref 0.7–1.3)
Gamma Glob SerPl Elph-Mcnc: 1.4 g/dL (ref 0.4–1.8)
Globulin, Total: 3.7 g/dL (ref 2.2–3.9)
IgA: 281 mg/dL (ref 87–352)
IgG (Immunoglobin G), Serum: 1411 mg/dL (ref 586–1602)
IgM (Immunoglobulin M), Srm: 105 mg/dL (ref 26–217)
Total Protein ELP: 7.1 g/dL (ref 6.0–8.5)

## 2023-05-20 MED ORDER — IRON-VITAMIN C 65-125 MG PO TABS: 1.0000 | ORAL_TABLET | Freq: Every day | ORAL | Status: AC

## 2023-05-20 NOTE — Assessment & Plan Note (Signed)
 Labs are reviewed and discussed with patient. Lab Results  Component Value Date   HGB 11.2 (L) 05/10/2023   TIBC 388 05/10/2023   IRONPCTSAT 18 05/10/2023   FERRITIN 120 05/10/2023  Status post IV Venofer  weekly x 4.  She tolerated well. Improved hemoglobin to 11.2.  Ferritin has improved. I will hold off IV Venofer  treatments.   Recommend patient to continue oral Vitron-C once daily. Etiology of IDA, ? GI blood loss refer to GI -she has upcoming appointment scheduled in July.

## 2023-05-20 NOTE — Progress Notes (Signed)
 Hematology/Oncology Progress note [phone] [phone]      Patient Care Team: Lyle San, MD as PCP - General (Family Medicine) Timmy Forbes, MD as Consulting Physician (Oncology)  REFERRING PROVIDER: Lyle San, MD  CHIEF COMPLAINTS/REASON FOR VISIT:  Anemia   ASSESSMENT & PLAN:   Iron  deficiency anemia Labs are reviewed and discussed with patient. Lab Results  Component Value Date   HGB 11.2 (L) 05/10/2023   TIBC 388 05/10/2023   IRONPCTSAT 18 05/10/2023   FERRITIN 120 05/10/2023  Status post IV Venofer  weekly x 4.  She tolerated well. Improved hemoglobin to 11.2.  Ferritin has improved. I will hold off IV Venofer  treatments.   Recommend patient to continue oral Vitron-C once daily. Etiology of IDA, ? GI blood loss refer to GI -she has upcoming appointment scheduled in July.  Anemia in chronic kidney disease (CKD) There maybe a component of anemia due to CKD.  Hemoglobin has improved.  Above 10.  CKD (chronic kidney disease) stage 3, GFR 30-59 ml/min (HCC) Encourage oral hydration and avoid nephrotoxins.  SPEP showed no M protein.  Normal light chain ratio.   Orders Placed This Encounter  Procedures   CBC with Differential (Cancer Center Only)    Standing Status:   Future    Expected Date:   11/20/2023    Expiration Date:   05/19/2024   Ferritin    Standing Status:   Future    Expected Date:   11/20/2023    Expiration Date:   05/19/2024   Retic Panel    Standing Status:   Future    Expected Date:   11/20/2023    Expiration Date:   05/19/2024   Iron  and TIBC    Standing Status:   Future    Expected Date:   11/20/2023    Expiration Date:   05/19/2024   Sign language interpreter presents with entire encounter. Follow-up in 6 months. All questions were answered. The patient knows to call the clinic with any problems, questions or concerns.  Timmy Forbes, MD, PhD [facility] Hematology Oncology 05/20/2023   HISTORY OF PRESENTING  ILLNESS:   Alice Hughes is a  68 y.o.  female with PMH listed below was seen in consultation at the request of  Lyle San, MD  for evaluation of anemia. She was seen by me once in 2021 and did not follow up   Patient has hearing impairment.  She was accompanied by her husband who also has hearing impairment. Online sign language interpreter service was utilized for the entire encounter.  She has history of chronic kidney disease-11/11/2019, Rheumatoid arthritis, on methotrexate.  She takes folic acid  daily.  Patient denies any unintentional weight loss, fever, night sweats.  She has good appetite.  She feels tired. Denies hematochezia, hematuria, hematemesis, epistaxis, black tarry stool. Not on anticoagulation.      INTERVAL HISTORY Alice Hughes is a 68 y.o. female who has above history reviewed by me today presents for follow up visit for anemia. She tolerated IV Venofer  treatments.  No significant side effects Fatigue has improved.  No new complaints.   Review of Systems  Constitutional:  Positive for fatigue. Negative for appetite change, chills and fever.  HENT:   Negative for hearing loss and voice change.   Eyes:  Negative for eye problems.  Respiratory:  Negative for chest tightness and cough.   Cardiovascular:  Positive for leg swelling. Negative for chest pain.  Gastrointestinal:  Negative for abdominal distention, abdominal pain and  blood in stool.  Endocrine: Negative for hot flashes.  Genitourinary:  Negative for difficulty urinating and frequency.   Musculoskeletal:  Negative for arthralgias.  Skin:  Negative for itching and rash.  Neurological:  Negative for extremity weakness.  Hematological:  Negative for adenopathy.  Psychiatric/Behavioral:  Negative for confusion.     MEDICAL HISTORY:  Past Medical History:  Diagnosis Date   Arthritis    Background retinopathy    Chronic kidney disease    Chronic venous insufficiency    Deaf    Diabetes mellitus  without complication (HCC)    Gallstones    Generalized osteoarthritis    Hearing impaired person, bilateral    Heart failure (HCC)    Heart failure with preserved ejection fraction (HCC)    Hyperlipidemia    Hypertension    Idiopathic chronic gout of foot without tophus    Lymphedema    Obesity    Pulmonary hypertension, moderate to severe (HCC)    Rheumatoid arthritis (HCC)    Transverse myelitis (HCC)    Type 2 diabetes mellitus with stage 3 chronic kidney disease (HCC)     SURGICAL HISTORY: Past Surgical History:  Procedure Laterality Date   ABDOMINAL HYSTERECTOMY     CARDIAC CATHETERIZATION     CHOLECYSTECTOMY     COLONOSCOPY N/A 05/07/2023   Procedure: COLONOSCOPY;  Surgeon: Shane Darling, MD;  Location: [facility] ENDOSCOPY;  Service: Endoscopy;  Laterality: N/A;  NEEDS SIGN INTERPRETER - TRISH REQUESTED ASL AT 8:45 AM, MAY 6TH   ESOPHAGOGASTRODUODENOSCOPY N/A 05/07/2023   Procedure: EGD (ESOPHAGOGASTRODUODENOSCOPY);  Surgeon: Shane Darling, MD;  Location: [facility] ENDOSCOPY;  Service: Endoscopy;  Laterality: N/A;   RIGHT HEART CATH AND CORONARY ANGIOGRAPHY Right 05/19/2019   Procedure: RIGHT HEART CATH;  Surgeon: Ronney Cola, MD;  Location: [facility] INVASIVE CV LAB;  Service: Cardiovascular;  Laterality: Right;    SOCIAL HISTORY: Social History   Socioeconomic History   Marital status: Married    Spouse name: Not on file   Number of children: Not on file   Years of education: Not on file   Highest education level: Not on file  Occupational History   Not on file  Tobacco Use   Smoking status: Never    Passive exposure: Never   Smokeless tobacco: Never  Vaping Use   Vaping status: Never Used  Substance and Sexual Activity   Alcohol use: Not Currently   Drug use: Never   Sexual activity: Not on file  Other Topics Concern   Not on file  Social History Narrative   Not on file   Social Drivers of Health   Financial Resource Strain: Low Risk  (04/03/2023)    Received from [facility]   Overall Financial Resource Strain (CARDIA)    Difficulty of Paying Living Expenses: Not very hard  Food Insecurity: Food Insecurity Present (04/03/2023)   Received from [facility]   Hunger Vital Sign    Worried About Running Out of Food in the Last Year: Sometimes true    Ran Out of Food in the Last Year: Sometimes true  Transportation Needs: No Transportation Needs (04/03/2023)   Received from [facility] - Transportation    In the past 12 months, has lack of transportation kept you from medical appointments or from getting medications?: No    Lack of Transportation (Non-Medical): No  Physical Activity: Not on file  Stress: Not on file  Social Connections: Not on  file  Intimate Partner Violence: Not At Risk (02/06/2023)   Humiliation, Afraid, Rape, and Kick questionnaire    Fear of Current or Ex-Partner: No    Emotionally Abused: No    Physically Abused: No    Sexually Abused: No    FAMILY HISTORY: Family History  Problem Relation Age of Onset   Diabetes Mother    Diabetes Father    Heart disease Father    Breast cancer Sister 24   Diabetes Brother     ALLERGIES:  is allergic to iodinated contrast media.  MEDICATIONS:  Current Outpatient Medications  Medication Sig Dispense Refill   colchicine 0.6 MG tablet Take 0.6 mg by mouth daily.     cyanocobalamin  (VITAMIN B12) 1000 MCG tablet Take 1,000 mcg by mouth daily.     Dulaglutide (TRULICITY) 0.75 MG/0.5ML SOAJ Inject 0.75 mg into the skin.     folic acid  (FOLVITE ) 1 MG tablet Take 1 mg by mouth daily.     furosemide (LASIX) 20 MG tablet Take 20 mg by mouth daily.     gabapentin (NEURONTIN) 300 MG capsule Take 300 mg by mouth 2 (two) times daily.     insulin  glargine (LANTUS) 100 UNIT/ML injection Inject 70 Units into the skin at bedtime.      Iron -Vitamin C  65-125 MG TABS Take 1 tablet by mouth daily.     JARDIANCE 10 MG TABS tablet  Take 10 mg by mouth daily.     lisinopril (ZESTRIL) 10 MG tablet Take 20 mg by mouth daily.     methotrexate (RHEUMATREX) 2.5 MG tablet Take 15 mg by mouth every Friday.     NOVOLOG  FLEXPEN 100 UNIT/ML FlexPen Inject into the skin. 40 units in morning and 50units in evening     rosuvastatin (CRESTOR) 5 MG tablet Take 5 mg by mouth at bedtime.      HYDROcodone -acetaminophen  (NORCO) 5-325 MG tablet Take 1 tablet by mouth every 6 (six) hours as needed for moderate pain. (Patient not taking: Reported on 05/08/2019) 15 tablet 0   oxyCODONE -acetaminophen  (PERCOCET) 5-325 MG tablet Take 1 tablet by mouth every 4 (four) hours as needed for severe pain. (Patient not taking: Reported on 03/13/2021) 15 tablet 0   No current facility-administered medications for this visit.   Facility-Administered Medications Ordered in Other Visits  Medication Dose Route Frequency Provider Last Rate Last Admin   sodium chloride  flush (NS) 0.9 % injection 3 mL  3 mL Intravenous Q12H Ronney Cola, MD         PHYSICAL EXAMINATION: ECOG PERFORMANCE STATUS: 2 - Symptomatic, <50% confined to bed Vitals:   05/20/23 1444  BP: (!) 137/54  Pulse: 80  Resp: 18  Temp: 98.3 F (36.8 C)   There were no vitals filed for this visit.   Physical Exam Constitutional:      General: She is not in acute distress.    Appearance: She is obese.  HENT:     Head: Normocephalic and atraumatic.  Eyes:     General: No scleral icterus. Cardiovascular:     Rate and Rhythm: Normal rate and regular rhythm.  Pulmonary:     Effort: Pulmonary effort is normal. No respiratory distress.     Breath sounds: No wheezing.  Abdominal:     General: There is no distension.  Musculoskeletal:        General: No deformity. Normal range of motion.     Cervical back: Normal range of motion and neck supple.  Skin:  General: Skin is warm and dry.     Findings: No erythema or rash.  Neurological:     Mental Status: She is alert. Mental status  is at baseline.     Comments: Chronic hearing impairment  Psychiatric:        Mood and Affect: Mood normal.     LABORATORY DATA:  I have reviewed the data as listed Lab Results  Component Value Date   WBC 7.9 05/10/2023   HGB 11.2 (L) 05/10/2023   HCT 34.5 (L) 05/10/2023   MCV 104.9 (H) 05/10/2023   PLT 200 05/10/2023   Recent Labs    05/07/23 1029  NA 138  K 4.4  CL 105  CO2 21*  GLUCOSE 457*  BUN 32*  CREATININE 1.60*  CALCIUM 9.3  GFRNONAA 35*   Iron /TIBC/Ferritin/ %Sat    Component Value Date/Time   IRON  70 05/10/2023 1411   TIBC 388 05/10/2023 1411   FERRITIN 120 05/10/2023 1411   IRONPCTSAT 18 05/10/2023 1411      RADIOGRAPHIC STUDIES: I have personally reviewed the radiological images as listed and agreed with the findings in the report. No results found.

## 2023-05-20 NOTE — Assessment & Plan Note (Signed)
 Encourage oral hydration and avoid nephrotoxins.  SPEP showed no M protein.  Normal light chain ratio.

## 2023-05-20 NOTE — Assessment & Plan Note (Signed)
 There maybe a component of anemia due to CKD.  Hemoglobin has improved.  Above 10.

## 2023-05-20 NOTE — Progress Notes (Signed)
 Venofer  was held. Patient didn't know she had a MD appt. VS was taken in infusion.

## 2023-06-19 ENCOUNTER — Other Ambulatory Visit: Payer: Self-pay | Admitting: Surgery

## 2023-06-25 ENCOUNTER — Encounter: Payer: Self-pay | Admitting: *Deleted

## 2023-06-26 ENCOUNTER — Other Ambulatory Visit: Payer: Self-pay

## 2023-06-26 ENCOUNTER — Encounter
Admission: RE | Admit: 2023-06-26 | Discharge: 2023-06-26 | Disposition: A | Discharge status: Home / Self Care | Source: Ambulatory Visit | Attending: Surgery | Admitting: Surgery

## 2023-06-26 VITALS — Ht 65.0 in | Wt 276.0 lb

## 2023-06-26 DIAGNOSIS — Z01812 Encounter for preprocedural laboratory examination: Secondary | ICD-10-CM

## 2023-06-26 DIAGNOSIS — E1169 Type 2 diabetes mellitus with other specified complication: Secondary | ICD-10-CM

## 2023-06-26 DIAGNOSIS — I272 Pulmonary hypertension, unspecified: Secondary | ICD-10-CM

## 2023-06-26 DIAGNOSIS — Z01810 Encounter for preprocedural cardiovascular examination: Secondary | ICD-10-CM

## 2023-06-26 NOTE — Patient Instructions (Addendum)
 Your procedure is scheduled on:  [facility] JULY 2  Report to the Registration Desk on the 1st floor of the [facility] To find out your arrival time, please call [phone] between 1PM - 3PM on:   TUESDAY JULY 1 If your arrival time is 6:00 am, do not arrive before that time as the [facility] entrance doors do not open until 6:00 am.  REMEMBER: Instructions that are not followed completely may result in serious medical risk, up to and including death; or upon the discretion of your surgeon and anesthesiologist your surgery may need to be rescheduled.  Do not eat food after midnight the night before surgery.  No gum chewing or hard candies.  You may however, drink WATER up to 2 hours before you are scheduled to arrive for your surgery. Do not drink anything within 2 hours of your scheduled arrival time.  In addition, your doctor has ordered for you to drink the provided:  Gatorade G2 Drinking this carbohydrate drink up to two hours before surgery helps to reduce insulin  resistance and improve patient outcomes. Please complete drinking 2 hours before scheduled arrival time.  One week prior to surgery:  [facility] JUNE 25  Stop Anti-inflammatories (NSAIDS) such as Advil , Aleve , Ibuprofen , Motrin , Naproxen , Naprosyn  and Aspirin  based products such as Excedrin, Goody's Powder, BC Powder. Stop ANY OVER THE COUNTER supplements until after surgery. cyanocobalamin  (VITAMIN B12)  folic acid  (FOLVITE )  Iron -Vitamin C    You may however, continue to take Tylenol  if needed for pain up until the day of surgery.  **Follow guidelines for insulin  and diabetes medications.** Dulaglutide (TRULICITY) hold 7 days prior to surgery, last dose TUESDAY JUNE 24   insulin  lispro (HUMALOG) do not take bedtime dose. Morning of surgery if CBG is greater than 220 may take 1/2 of usual dose ( 25 units)   JARDIANCE hold 3 days prior to surgery, last dose SATURDAY JUNE 28   LANTUS SOLOSTAR hold bedtime dose the  night before surgery last dose Monday JUNE 30   Patient was encouraged to consult Dr. Damian regarding the suggested instructions for her diabetes medication.  Continue taking all of your other prescription medications up until the day of surgery.  ON THE DAY OF SURGERY DO NOT TAKE ANY MEDICATIONS  No Alcohol for 24 hours before or after surgery.  No Smoking including e-cigarettes for 24 hours before surgery.  No chewable tobacco products for at least 6 hours before surgery.  No nicotine patches on the day of surgery.  Do not use any recreational drugs for at least a week (preferably 2 weeks) before your surgery.  Please be advised that the combination of cocaine and anesthesia may have negative outcomes, up to and including death. If you test positive for cocaine, your surgery will be cancelled.  On the morning of surgery brush your teeth with toothpaste and water, you may rinse your mouth with mouthwash if you wish. Do not swallow any toothpaste or mouthwash.  Use CHG Soap as directed on instruction sheet.  Do not wear jewelry, make-up, hairpins, clips or nail polish.  For welded (permanent) jewelry: bracelets, anklets, waist bands, etc.  Please have this removed prior to surgery.  If it is not removed, there is a chance that hospital personnel will need to cut it off on the day of surgery.  Do not wear lotions, powders, or perfumes.   Do not shave body hair from the neck down 48 hours before surgery.  Contact lenses, hearing aids  and dentures may not be worn into surgery.  Do not bring valuables to the hospital. [facility] is not responsible for any missing/lost belongings or valuables.   Notify your doctor if there is any change in your medical condition (cold, fever, infection).  Wear comfortable clothing (specific to your surgery type) to the hospital.  After surgery, you can help prevent lung complications by doing breathing exercises.  Take deep breaths and cough  every 1-2 hours. Your doctor may order a device called an Incentive Spirometer to help you take deep breaths.  If you are being discharged the day of surgery, you will not be allowed to drive home. You will need a responsible individual to drive you home and stay with you for 24 hours after surgery.   If you are taking public transportation, you will need to have a responsible individual with you.  Please call the Pre-admissions Testing Dept. at [phone] if you have any questions about these instructions.  Surgery Visitation Policy:  Patients having surgery or a procedure may have two visitors.  Children under the age of 60 must have an adult with them who is not the patient.   Merchandiser, retail to address health-related social needs:  [email or web address]        STARTING THE EVENING OF TUESDAY JULY 1   Preparing for Surgery with CHLORHEXIDINE GLUCONATE (CHG) Soap  Chlorhexidine Gluconate (CHG) Soap  o An antiseptic cleaner that kills germs and bonds with the skin to continue killing germs even after washing  o Used for showering the night before surgery and morning of surgery  Before surgery, you can play an important role by reducing the number of germs on your skin.  CHG (Chlorhexidine gluconate) soap is an antiseptic cleanser which kills germs and bonds with the skin to continue killing germs even after washing.  Please do not use if you have an allergy to CHG or antibacterial soaps. If your skin becomes reddened/irritated stop using the CHG.  1. Shower the NIGHT BEFORE SURGERY and the MORNING OF SURGERY with CHG soap.  2. If you choose to wash your hair, wash your hair first as usual with your normal shampoo.  3. After shampooing, rinse your hair and body thoroughly to remove the shampoo.  4. Use CHG as you would any other liquid soap. You can apply CHG directly to the skin and wash gently with a scrungie or a clean washcloth.  5. Apply  the CHG soap to your body only from the neck down. Do not use on open wounds or open sores. Avoid contact with your eyes, ears, mouth, and genitals (private parts). Wash face and genitals (private parts) with your normal soap.  6. Wash thoroughly, paying special attention to the area where your surgery will be performed.  7. Thoroughly rinse your body with warm water.  8. Do not shower/wash with your normal soap after using and rinsing off the CHG soap.  9. Pat yourself dry with a clean towel.  10. Wear clean pajamas to bed the night before surgery.  11. Place clean sheets on your bed the night of your first shower and do not sleep with pets.  12. Shower again with the CHG soap on the day of surgery prior to arriving at the hospital.  13. Do not apply any deodorants/lotions/powders.  14. Please wear clean clothes to the hospital.

## 2023-06-26 NOTE — Pre-Procedure Instructions (Signed)
 Patient was encouraged to consult Dr. Damian regarding the suggested instructions for her diabetes medication. The patient verbalized understanding and stated she would contact Dr. Damian regarding the diabetes medication regimen prior to surgery.

## 2023-06-28 NOTE — Progress Notes (Signed)
 Patient called same day surgery confused about her insulin  instructions for surgery. Instructions given as follows:  Trulicity - 7 day hold; do NOT take on Saturday, 6/28  Humalog insulin  - take usual doses day before surgery, 7/1; do NOT take any humalog the morning of surgery.  Lantus - only take half of usual dose the night before surgery; only take 40 units on 7/1  Patient acknowledges understanding.

## 2023-07-01 ENCOUNTER — Encounter: Payer: Self-pay | Admitting: Surgery

## 2023-07-01 NOTE — Progress Notes (Signed)
 Perioperative / Anesthesia Services Pre-Admission Testing Clinical Review / Pre-Operative Anesthesia Consult  Date: 07/02/23  PATIENT DEMOGRAPHICS: Name: Alice Hughes DOB: 04-30-1955 MRN:   [id number]  Note: Available PAT nursing documentation and vital signs have been reviewed. Clinical nursing staff has updated patient's PMH/PSHx, current medication list, and drug allergies/intolerances to ensure complete and comprehensive history available to assist care teams in MDM as it pertains to the aforementioned surgical procedure and anticipated anesthetic course. Extensive review of available clinical information personally performed. [place] [facility] and PSHx updated with any diagnoses/procedures that  may have been inadvertently omitted during his intake with the pre-admission testing department's nursing staff.  PLANNED SURGICAL PROCEDURE(S):   Case: [id number] Date/Time: 07/03/23 1154   Procedure: EXCISION MASS UPPER EXTREMITIES (Right: Wrist)   Anesthesia type: Choice   Diagnosis:      Ganglion of right wrist [M67.431]     Synovial cyst of right wrist [M71.331]   Pre-op diagnosis:      Ganglion of right wrist M67.431     Synovial cyst of right wrist M71.331   Location: [facility] OR ROOM 02 / [facility]   Surgeons: Edie Norleen PARAS, MD        CLINICAL DISCUSSION: Alice Hughes is a 68 y.o. female who is submitted for pre-surgical anesthesia review and clearance prior to her undergoing the above procedure. Patient has never been a smoker in the past. Pertinent PMH includes: HFpEF, pulmonary hypertension, RBBB, HTN, T2DM, CKD-III, hiatal hernia, anemia of CKD, OA, RA, transverse myelitis, synovial cyst of RIGHT wrist, lymphedema.  Patient is followed by cardiology Juanice, MD). She was last seen in the cardiology clinic on 03/13/2023; notes reviewed. At the time of her clinic visit, patient doing well overall from a cardiovascular perspective. Patient denied any chest pain,  shortness of breath, PND, orthopnea, palpitations, significant peripheral edema, weakness, fatigue, vertiginous symptoms, or presyncope/syncope. Patient with a past medical history significant for cardiovascular diagnoses. Documented physical exam was grossly benign, providing no evidence of acute exacerbation and/or decompensation of the patient's known cardiovascular conditions.  Patient underwent diagnostic RIGHT heart catheterization on 05/19/2019.  Hemodynamics: mean RA = 20 mmHg, mean PA = 38 mmHg, mean PCWP = 38 mmHg, PA saturation = 48%, AO saturation = 88%, PVR = 0.58 Wood units, CO = 5.14 L/min, and CI = 2.24 L/min/m.  Findings consistent with patient's known moderate pulmonary hypertension.  Most recent TTE performed on 03/07/2023 revealed a normal left ventricular systolic function with an EF of >55% %. There was mild LVH.  There were no regional wall motion abnormalities. Left ventricular diastolic Doppler parameters consistent with abnormal relaxation (G1DD). Right ventricular size and function normal with a TAPSE measuring 2.1 cm  (normal range >/= 1.6 cm).  There was trivial mitral and tricuspid valve regurgitation.  All transvalvular gradients were noted to be normal providing no evidence of hemodynamically significant valvular stenosis. Aorta normal in size with no evidence of ectasia or aneurysmal dilatation.  Blood pressure reasonably controlled at 130/66 mmHg on currently prescribed diuretic (furosemide) and ACEi (lisinopril) therapies.  Patient is on rosuvastatin for her HLD diagnosis and ASCVD prevention.  T2DM uncontrolled on currently prescribed regimen; last HgbA1c was 9.4% when checked on 05/30/2023.  In the setting of known cardiovascular diagnoses and concurrent T2DM, patient is taking an SGLT2i (empagliflozin) for added cardiovascular and renovascular protection. Patient does not have an OSAH diagnosis. She does not have an OSAH diagnosis. Patient is able to complete all of her  ADL/IADLs without cardiovascular limitation.  Per the DASI, patient is able to achieve at least 4 METS of physical activity without experiencing any significant degree of angina/anginal equivalent symptoms.  A cavity no changes were made to her medication regimen during her visit with cardiology.  Patient scheduled to follow-up with outpatient cardiology in 6 months or sooner if needed.  Alice Hughes is scheduled for an elective SOFT TISSUE MASS EXCISION RIGHT WRIST on 07/03/2023 with Dr. Norleen Maltos, MD. Given patient's past medical history significant for cardiovascular diagnoses, presurgical cardiac clearance was sought by the PAT team. Per cardiology, this patient is optimized for surgery and may proceed with the planned procedural course with a LOW risk of significant perioperative cardiovascular complications.  In review of her medication reconciliation, the patient is not noted to be taking any type of anticoagulation or antiplatelet therapies that would need to be held during her perioperative course.  Patient denies previous perioperative complications with anesthesia in the past. In review her EMR, there are no records available for review pertaining to any anesthetic courses within the [facility] system in the recent past.   MOST RECENT VITAL SIGNS:    06/26/2023   11:32 AM 05/20/2023    2:44 PM 05/20/2023    2:41 PM  Vitals with BMI  Height 5' 5    Weight 276 lbs    BMI 45.93    Systolic  137 137  Diastolic  54 54  Pulse  80 80   PROVIDERS/SPECIALISTS: NOTE: Primary physician provider listed below. Patient may have been seen by APP or partner within same practice.   PROVIDER ROLE / SPECIALTY LAST OV  Poggi, Norleen PARAS, MD Orthopedics (Surgeon) 06/14/2023  Valora Agent, MD Primary Care Provider 12/04/2022  Dewane Shiner, DO Cardiology 03/13/2023  Damian Setter, MD Endocrinology 05/30/2023  Tobie Solo, MD Rheumatology 04/22/2023  Leavy New, MD Nephrology 01/29/2023    ALLERGIES: Allergies  Allergen Reactions   Iodinated Contrast Media Other (See Comments)    Per pt she has hx of dye allergy and has iodinated contrast media allergy listed in [facility] chart. MSY     CURRENT HOME MEDICATIONS: No current facility-administered medications for this encounter.    acetaminophen  (TYLENOL ) 500 MG tablet   allopurinol (ZYLOPRIM) 300 MG tablet   colchicine 0.6 MG tablet   cyanocobalamin  (VITAMIN B12) 1000 MCG tablet   Dulaglutide (TRULICITY) 0.75 MG/0.5ML SOAJ   folic acid  (FOLVITE ) 1 MG tablet   furosemide (LASIX) 20 MG tablet   gabapentin (NEURONTIN) 300 MG capsule   insulin  lispro (HUMALOG) 100 UNIT/ML KwikPen   Iron -Vitamin C  65-125 MG TABS   JARDIANCE 10 MG TABS tablet   LANTUS SOLOSTAR 100 UNIT/ML Solostar Pen   lisinopril (ZESTRIL) 10 MG tablet   methotrexate (RHEUMATREX) 2.5 MG tablet   rosuvastatin (CRESTOR) 5 MG tablet    sodium chloride  flush (NS) 0.9 % injection 3 mL   HISTORY: Past Medical History:  Diagnosis Date   Anemia in chronic kidney disease (CKD) 02/06/2023   Arthritis    Chronic venous insufficiency    CKD (chronic kidney disease), stage III (HCC)    Deaf    Diabetic retinopathy (HCC)    Diverticulitis    Gallstones    Heart failure with preserved ejection fraction (HCC)    Hiatal hernia    Hyperlipidemia    Hypertension    Idiopathic chronic gout of foot without tophus    Long term methotrexate user    Lymphedema    Myalgia 03/03/2018  Numbness and tingling in both hands    Obesity    Pulmonary hypertension (HCC)    RBBB (right bundle branch block)    Rheumatoid arthritis (HCC)    a.) Tx'd with long term MTX   Synovial cyst of right wrist    T2DM (type 2 diabetes mellitus) (HCC)    Transverse myelitis (HCC)    Ventral abdominal wall hernia    Past Surgical History:  Procedure Laterality Date   CARDIAC CATHETERIZATION     CHOLECYSTECTOMY     COLONOSCOPY N/A 05/07/2023   Procedure: COLONOSCOPY;  Surgeon:  Maryruth Ole DASEN, MD;  Location: [facility] ENDOSCOPY;  Service: Endoscopy;  Laterality: N/A;  NEEDS SIGN INTERPRETER - TRISH REQUESTED ASL AT 8:45 AM, MAY 6TH   ESOPHAGOGASTRODUODENOSCOPY N/A 05/07/2023   Procedure: EGD (ESOPHAGOGASTRODUODENOSCOPY);  Surgeon: Maryruth Ole DASEN, MD;  [facility] ENDOSCOPY;  Service: Endoscopy;  Laterality: N/A;   HYSTERECTOMY, VAGINAL, WITH SALPINGO-OOPHORECTOMY N/A    RIGHT HEART CATH AND CORONARY ANGIOGRAPHY Right 05/19/2019   Procedure: RIGHT HEART CATH;  Surgeon: Alice Vinie LABOR, MD;  Location: [facility] INVASIVE CV LAB;  Service: Cardiovascular;  Laterality: Right;   Family History  Problem Relation Age of Onset   Diabetes Mother    Diabetes Father    Heart disease Father    Breast cancer Sister 42   Diabetes Brother    Social History   Tobacco Use   Smoking status: Never    Passive exposure: Never   Smokeless tobacco: Never  Substance Use Topics   Alcohol use: Not Currently   LABS:  Lab Results  Component Value Date   WBC 7.9 05/10/2023   HGB 11.2 (L) 05/10/2023   HCT 34.5 (L) 05/10/2023   MCV 104.9 (H) 05/10/2023   PLT 200 05/10/2023   Lab Results  Component Value Date   NA 138 05/07/2023   CL 105 05/07/2023   K 4.4 05/07/2023   CO2 21 (L) 05/07/2023   BUN 32 (H) 05/07/2023   CREATININE 1.60 (H) 05/07/2023   GFRNONAA 35 (L) 05/07/2023   CALCIUM 9.3 05/07/2023   ALBUMIN 3.7 03/13/2021   GLUCOSE 457 (H) 05/07/2023   Component Ref Range & Units 05/30/2023  Hemoglobin A1C 4.2 - 5.6 % 9.4 High   Average Blood Glucose (Calc) mg/dL 776    ECG: Date: 98/70/7974  Time ECG obtained: 1725 PM Rate: 84 bpm Rhythm: NSR; RBBB Axis (leads I and aVF): normal Intervals: PR 176 ms. QRS 136 ms. QTc 458 ms. ST segment and T wave changes: Inferior T wave abnormalities  Comparison: Similar to previous tracing obtained on 12/07/2021 NOTE: Tracing obtained at [facility]; unable for review. Above based on cardiologist's interpretation.     IMAGING / PROCEDURES: DIAGNOSTIC RADIOGRAPHS OF RIGHT WRIST 3+ VIEWS performed on 06/14/2023 No evidence for fractures, lytic lesions, or significant degenerative changes.   There is an obvious soft tissue shadow along the radial aspect of her wrist, consistent with the observed soft tissue mass on examination.   TRANSTHORACIC ECHOCARDIOGRAM performed on 03/07/2023 Normal left ventricular systolic function with an EF of >55% Mild LVH Left ventricular diastolic Doppler parameters consistent with abnormal relaxation (G1DD). Normal right ventricular size and function Trivial MR and PR Normal gradients; no valvular stenosis  CT ABDOMEN PELVIS WO CONTRAST performed on 03/13/2021 Findings likely represent mild acute diverticulitis in the sigmoid colon. Small hiatal hernia. Ventral abdominal wall hernia containing fat and loops of bowel.  RIGHT HEART CATHETERIZATION AND CORONARY ANGIOGRAPHY performed on 05/19/2019 RA 23/23(20)  mmHg RV 61/12(26) mmHg PA 59/25(38) mmHg PCWP 34/38(38) mmHg PA saturation 48 Saturation AO 88 Cardiac Output (Fick) 5.14  Cardiac Index (Fick) 2.24  Pulmonary vascular resistance (PVR): 0.58 Woods units. Final Conclusions: Mild elevated pcwp with moderate pulmonary hypertension   IMPRESSION AND PLAN: Alice Hughes has been referred for pre-anesthesia review and clearance prior to her undergoing the planned anesthetic and procedural courses. Available labs, pertinent testing, and imaging results were personally reviewed by me in preparation for upcoming operative/procedural course. [facility] medical record has been updated following extensive record review and patient interview with PAT staff.   This patient has been appropriately cleared by cardiology with an overall LOW risk of patient experiencing significant perioperative cardiovascular complications. Based on clinical review performed today (07/02/23), barring any significant acute changes in the  patient's overall condition, it is anticipated that she will be able to proceed with the planned surgical intervention. Any acute changes in clinical condition may necessitate her procedure being postponed and/or cancelled. Patient will meet with anesthesia team (MD and/or CRNA) on the day of her procedure for preoperative evaluation/assessment. Questions regarding anesthetic course will be fielded at that time.   Pre-surgical instructions were reviewed with the patient during his PAT appointment, and questions were fielded to satisfaction by PAT clinical staff. She has been instructed on which medications that she will need to hold prior to surgery, as well as the ones that have been deemed safe/appropriate to take on the day of her procedure. As part of the general education provided by PAT, patient made aware both verbally and in writing, that she would need to abstain from the use of any illegal substances during her perioperative course. She was advised that failure to follow the provided instructions could necessitate case cancellation or result in serious perioperative complications up to and including death. Patient encouraged to contact PAT and/or her surgeon's office to discuss any questions or concerns that may arise prior to surgery; verbalized understanding.   Alice Pereyra, MSN, APRN, FNP-C, CEN [facility]  Perioperative Services Nurse Practitioner Phone: [phone] Fax: [phone] 07/02/23 9:38 AM  NOTE: This note has been prepared using Dragon dictation software. Despite my best ability to proofread, there is always the potential that unintentional transcriptional errors may still occur from this process.

## 2023-07-01 NOTE — Progress Notes (Addendum)
  Perioperative Services: Pre-Admission/Anesthesia Testing  Abnormal Lab Notification    Date: 07/01/23  Name: Alice Hughes MRN:   [id number]  Re: Abnormal labs noted during PAT appointment   Provider(s) Notified: Poggi, Norleen PARAS, MD Notification mode: Routed and/or faxed via [facility]   ABNORMAL LAB VALUE(S): Lab Results  Component Value Date   GLUCOSE 457 (H) 05/07/2023    Notes: Patient with a diagnosis of poorly controlled T2DM. She is currently on both oral (Jardiance) and parenteral (dulaglutide, Humalog, Lantus) therapies. Last Hgb A1c was 9.4% on 05/30/2023. In efforts to reduce the risk of developing SSI, or other potential perioperative complications, this communication is being sent in order to determine if patient is deemed to be adequately optimized  for surgery.    Of note, recent routine colonoscopy was previously canceled on the day of procedure (05/07/2023) due to patient with blood glucose levels >400 mg/dL.  Patient is under the care of endocrinology Raiford, MD). Recent medication changes were made on 05/30/2023, as A1c had increased from 8.4 in 02/2023 to 9.4 in 05/2023. Patient scheduled to follow up with endocrinology in 3 months.   In light of the aforementioned A1c, her diabetes could pose increased risks for the patient during their perioperative course and subsequent recovery period. With that being said, the benefit of improving glycemic control must be weighed against the overall risks associated with delaying a necessary elective surgical procedure for this patient.   ADDENDUM 07/01/2023 @ 1120 AM: Received return communication from Dr. Edie, MD indicating that given the low complexity of the case and the superficial nature of the cyst, plans are to proceed with case as planned.   Alice Pereyra, MSN, APRN, FNP-C, CEN [facility]  Perioperative Services Nurse Practitioner Phone: [phone] Fax: [phone] 07/01/23 10:07 AM

## 2023-07-03 ENCOUNTER — Other Ambulatory Visit: Payer: Self-pay

## 2023-07-03 ENCOUNTER — Encounter: Payer: Self-pay | Admitting: Surgery

## 2023-07-03 ENCOUNTER — Ambulatory Visit: Payer: Self-pay | Admitting: Urgent Care

## 2023-07-03 ENCOUNTER — Encounter
Admission: RE | Disposition: A | Discharge status: Home / Self Care | Payer: Self-pay | Source: Ambulatory Visit | Attending: Surgery

## 2023-07-03 ENCOUNTER — Ambulatory Visit
Admission: RE | Admit: 2023-07-03 | Discharge: 2023-07-03 | Disposition: A | Discharge status: Home / Self Care | Source: Ambulatory Visit | Attending: Surgery | Admitting: Surgery

## 2023-07-03 DIAGNOSIS — Z01810 Encounter for preprocedural cardiovascular examination: Secondary | ICD-10-CM

## 2023-07-03 DIAGNOSIS — M069 Rheumatoid arthritis, unspecified: Secondary | ICD-10-CM | POA: Diagnosis not present

## 2023-07-03 DIAGNOSIS — N183 Chronic kidney disease, stage 3 unspecified: Secondary | ICD-10-CM | POA: Insufficient documentation

## 2023-07-03 DIAGNOSIS — E1122 Type 2 diabetes mellitus with diabetic chronic kidney disease: Secondary | ICD-10-CM | POA: Diagnosis not present

## 2023-07-03 DIAGNOSIS — E1169 Type 2 diabetes mellitus with other specified complication: Secondary | ICD-10-CM

## 2023-07-03 DIAGNOSIS — I5032 Chronic diastolic (congestive) heart failure: Secondary | ICD-10-CM | POA: Insufficient documentation

## 2023-07-03 DIAGNOSIS — Z7985 Long-term (current) use of injectable non-insulin antidiabetic drugs: Secondary | ICD-10-CM | POA: Insufficient documentation

## 2023-07-03 DIAGNOSIS — E11319 Type 2 diabetes mellitus with unspecified diabetic retinopathy without macular edema: Secondary | ICD-10-CM | POA: Insufficient documentation

## 2023-07-03 DIAGNOSIS — I272 Pulmonary hypertension, unspecified: Secondary | ICD-10-CM

## 2023-07-03 DIAGNOSIS — M67431 Ganglion, right wrist: Secondary | ICD-10-CM | POA: Diagnosis present

## 2023-07-03 DIAGNOSIS — Z7984 Long term (current) use of oral hypoglycemic drugs: Secondary | ICD-10-CM | POA: Diagnosis not present

## 2023-07-03 DIAGNOSIS — I130 Hypertensive heart and chronic kidney disease with heart failure and stage 1 through stage 4 chronic kidney disease, or unspecified chronic kidney disease: Secondary | ICD-10-CM | POA: Insufficient documentation

## 2023-07-03 DIAGNOSIS — M71331 Other bursal cyst, right wrist: Secondary | ICD-10-CM | POA: Diagnosis present

## 2023-07-03 DIAGNOSIS — Z794 Long term (current) use of insulin: Secondary | ICD-10-CM | POA: Diagnosis not present

## 2023-07-03 DIAGNOSIS — D631 Anemia in chronic kidney disease: Secondary | ICD-10-CM | POA: Insufficient documentation

## 2023-07-03 HISTORY — DX: Diverticulitis of intestine, part unspecified, without perforation or abscess without bleeding: K57.92

## 2023-07-03 HISTORY — DX: Ventral hernia without obstruction or gangrene: K43.9

## 2023-07-03 HISTORY — DX: Pulmonary hypertension, unspecified: I27.20

## 2023-07-03 HISTORY — DX: Type 2 diabetes mellitus with unspecified diabetic retinopathy without macular edema: E11.319

## 2023-07-03 HISTORY — DX: Chronic kidney disease, stage 3 unspecified: N18.30

## 2023-07-03 HISTORY — DX: Unspecified right bundle-branch block: I45.10

## 2023-07-03 HISTORY — PX: EXCISION MASS UPPER EXTREMETIES: SHX6704

## 2023-07-03 HISTORY — DX: Long term (current) use of antimetabolite agent: Z79.631

## 2023-07-03 HISTORY — DX: Diaphragmatic hernia without obstruction or gangrene: K44.9

## 2023-07-03 HISTORY — DX: Type 2 diabetes mellitus without complications: E11.9

## 2023-07-03 LAB — GLUCOSE, CAPILLARY
Glucose-Capillary: 298 mg/dL — ABNORMAL HIGH (ref 70–99)
Glucose-Capillary: 328 mg/dL — ABNORMAL HIGH (ref 70–99)

## 2023-07-03 SURGERY — EXCISION MASS UPPER EXTREMITIES
Anesthesia: General | Site: Wrist | Laterality: Right

## 2023-07-03 MED ORDER — CHLORHEXIDINE GLUCONATE 0.12 % MT SOLN
OROMUCOSAL | Status: AC
  Filled 2023-07-03: qty 15

## 2023-07-03 MED ORDER — CEFAZOLIN SODIUM-DEXTROSE 2-4 GM/100ML-% IV SOLN
INTRAVENOUS | Status: AC
  Filled 2023-07-03: qty 100

## 2023-07-03 MED ORDER — FENTANYL CITRATE (PF) 100 MCG/2ML IJ SOLN: 25.0000 ug | INTRAMUSCULAR | Status: DC | PRN

## 2023-07-03 MED ORDER — ACETAMINOPHEN 10 MG/ML IV SOLN
INTRAVENOUS | Status: AC
  Filled 2023-07-03: qty 100

## 2023-07-03 MED ORDER — PROPOFOL 10 MG/ML IV BOLUS
INTRAVENOUS | Status: AC
  Filled 2023-07-03: qty 20

## 2023-07-03 MED ORDER — PROPOFOL 10 MG/ML IV BOLUS
INTRAVENOUS | Status: DC | PRN
Start: 2023-07-03 — End: 2023-07-03
  Administered 2023-07-03: 120 mg via INTRAVENOUS

## 2023-07-03 MED ORDER — PHENYLEPHRINE 80 MCG/ML (10ML) SYRINGE FOR IV PUSH (FOR BLOOD PRESSURE SUPPORT)
PREFILLED_SYRINGE | INTRAVENOUS | Status: AC
  Filled 2023-07-03: qty 10

## 2023-07-03 MED ORDER — CEFAZOLIN SODIUM-DEXTROSE 2-4 GM/100ML-% IV SOLN
2.0000 g | INTRAVENOUS | Status: AC
  Administered 2023-07-03: 3 g via INTRAVENOUS

## 2023-07-03 MED ORDER — FENTANYL CITRATE (PF) 100 MCG/2ML IJ SOLN
INTRAMUSCULAR | Status: AC
  Filled 2023-07-03: qty 2

## 2023-07-03 MED ORDER — OXYCODONE HCL 5 MG PO TABS
5.0000 mg | ORAL_TABLET | Freq: Once | ORAL | Status: AC | PRN
  Administered 2023-07-03: 5 mg via ORAL

## 2023-07-03 MED ORDER — 0.9 % SODIUM CHLORIDE (POUR BTL) OPTIME
TOPICAL | Status: DC | PRN
  Administered 2023-07-03: 500 mL

## 2023-07-03 MED ORDER — SODIUM CHLORIDE 0.9 % IV SOLN: INTRAVENOUS | Status: DC

## 2023-07-03 MED ORDER — ACETAMINOPHEN 10 MG/ML IV SOLN
INTRAVENOUS | Status: DC | PRN
  Administered 2023-07-03: 1000 mg via INTRAVENOUS

## 2023-07-03 MED ORDER — HYDROCODONE-ACETAMINOPHEN 5-325 MG PO TABS: 1.0000 | ORAL_TABLET | ORAL | 0 refills | Status: AC | PRN

## 2023-07-03 MED ORDER — KETOROLAC TROMETHAMINE 15 MG/ML IJ SOLN
15.0000 mg | Freq: Once | INTRAMUSCULAR | Status: AC
  Administered 2023-07-03: 15 mg via INTRAVENOUS

## 2023-07-03 MED ORDER — EPHEDRINE 5 MG/ML INJ
INTRAVENOUS | Status: AC
  Filled 2023-07-03: qty 5

## 2023-07-03 MED ORDER — BUPIVACAINE HCL (PF) 0.5 % IJ SOLN
INTRAMUSCULAR | Status: AC
Start: 2023-07-03 — End: 2023-07-03
  Filled 2023-07-03: qty 30

## 2023-07-03 MED ORDER — CHLORHEXIDINE GLUCONATE 0.12 % MT SOLN: 15.0000 mL | Freq: Once | OROMUCOSAL | Status: DC

## 2023-07-03 MED ORDER — ORAL CARE MOUTH RINSE: 15.0000 mL | Freq: Once | OROMUCOSAL | Status: DC

## 2023-07-03 MED ORDER — PHENYLEPHRINE 80 MCG/ML (10ML) SYRINGE FOR IV PUSH (FOR BLOOD PRESSURE SUPPORT)
PREFILLED_SYRINGE | INTRAVENOUS | Status: DC | PRN
Start: 2023-07-03 — End: 2023-07-03
  Administered 2023-07-03 (×4): 80 ug via INTRAVENOUS

## 2023-07-03 MED ORDER — OXYCODONE HCL 5 MG PO TABS
ORAL_TABLET | ORAL | Status: AC
  Filled 2023-07-03: qty 1

## 2023-07-03 MED ORDER — EPHEDRINE SULFATE-NACL 50-0.9 MG/10ML-% IV SOSY
PREFILLED_SYRINGE | INTRAVENOUS | Status: DC | PRN
  Administered 2023-07-03: 10 mg via INTRAVENOUS
  Administered 2023-07-03: 5 mg via INTRAVENOUS

## 2023-07-03 MED ORDER — ONDANSETRON HCL 4 MG/2ML IJ SOLN
INTRAMUSCULAR | Status: DC | PRN
  Administered 2023-07-03: 4 mg via INTRAVENOUS

## 2023-07-03 MED ORDER — ONDANSETRON HCL 4 MG/2ML IJ SOLN
INTRAMUSCULAR | Status: AC
  Filled 2023-07-03: qty 2

## 2023-07-03 MED ORDER — BUPIVACAINE HCL (PF) 0.5 % IJ SOLN
INTRAMUSCULAR | Status: DC | PRN
  Administered 2023-07-03: 10 mL

## 2023-07-03 MED ORDER — LIDOCAINE HCL (PF) 2 % IJ SOLN
INTRAMUSCULAR | Status: AC
  Filled 2023-07-03: qty 5

## 2023-07-03 MED ORDER — FENTANYL CITRATE (PF) 100 MCG/2ML IJ SOLN
INTRAMUSCULAR | Status: DC | PRN
  Administered 2023-07-03: 50 ug via INTRAVENOUS
  Administered 2023-07-03 (×2): 25 ug via INTRAVENOUS

## 2023-07-03 MED ORDER — CEFAZOLIN SODIUM 1 G IJ SOLR
INTRAMUSCULAR | Status: AC
  Filled 2023-07-03: qty 10

## 2023-07-03 MED ORDER — KETOROLAC TROMETHAMINE 15 MG/ML IJ SOLN
INTRAMUSCULAR | Status: AC
Start: 2023-07-03 — End: 2023-07-03
  Filled 2023-07-03: qty 1

## 2023-07-03 MED ORDER — LIDOCAINE HCL (CARDIAC) PF 100 MG/5ML IV SOSY
PREFILLED_SYRINGE | INTRAVENOUS | Status: DC | PRN
  Administered 2023-07-03: 100 mg via INTRAVENOUS

## 2023-07-03 MED ORDER — OXYCODONE HCL 5 MG/5ML PO SOLN: 5.0000 mg | Freq: Once | ORAL | Status: AC | PRN

## 2023-07-03 SURGICAL SUPPLY — 33 items
BENZOIN TINCTURE PRP APPL 2/3 (GAUZE/BANDAGES/DRESSINGS) IMPLANT
BNDG COHESIVE 4X5 TAN STRL LF (GAUZE/BANDAGES/DRESSINGS) ×1 IMPLANT
BNDG ELASTIC 4X5.8 VLCR NS LF (GAUZE/BANDAGES/DRESSINGS) ×1 IMPLANT
BNDG ESMARCH 4X12 STRL LF (GAUZE/BANDAGES/DRESSINGS) ×1 IMPLANT
CHLORAPREP W/TINT 26 (MISCELLANEOUS) ×1 IMPLANT
CORD BIP STRL DISP 12FT (MISCELLANEOUS) ×1 IMPLANT
DRAPE SURG 17X11 SM STRL (DRAPES) ×1 IMPLANT
ELECTRODE REM PT RTRN 9FT ADLT (ELECTROSURGICAL) ×1 IMPLANT
FORCEPS JEWEL BIP 4-3/4 STR (INSTRUMENTS) ×1 IMPLANT
GAUZE SPONGE 4X4 12PLY STRL (GAUZE/BANDAGES/DRESSINGS) ×1 IMPLANT
GLOVE BIO SURGEON STRL SZ8 (GLOVE) ×1 IMPLANT
GLOVE INDICATOR 8.0 STRL GRN (GLOVE) ×1 IMPLANT
GOWN STRL REUS W/ TWL LRG LVL3 (GOWN DISPOSABLE) ×1 IMPLANT
GOWN STRL REUS W/ TWL XL LVL3 (GOWN DISPOSABLE) ×1 IMPLANT
HOLSTER ELECTROSUGICAL PENCIL (MISCELLANEOUS) IMPLANT
KIT TURNOVER KIT A (KITS) ×1 IMPLANT
LOOP VESSEL MAXI 1X406 RED (MISCELLANEOUS) IMPLANT
MANIFOLD NEPTUNE II (INSTRUMENTS) ×1 IMPLANT
NS IRRIG 500ML POUR BTL (IV SOLUTION) ×1 IMPLANT
PACK EXTREMITY ARMC (MISCELLANEOUS) ×1 IMPLANT
PAD CAST 4YDX4 CTTN HI CHSV (CAST SUPPLIES) ×1 IMPLANT
PAD PREP OB/GYN DISP 24X41 (PERSONAL CARE ITEMS) ×1 IMPLANT
PENCIL SMOKE EVACUATOR (MISCELLANEOUS) ×1 IMPLANT
SPLINT WRIST XL RT TX990305 (SOFTGOODS) IMPLANT
STOCKINETTE IMPERV 14X48 (MISCELLANEOUS) ×1 IMPLANT
STRIP CLOSURE SKIN 1/2X4 (GAUZE/BANDAGES/DRESSINGS) ×1 IMPLANT
STRIP CLOSURE SKIN 1/4X4 (GAUZE/BANDAGES/DRESSINGS) IMPLANT
SUT PROLENE 4 0 PS 2 18 (SUTURE) ×1 IMPLANT
SUT VIC AB 2-0 SH 27XBRD (SUTURE) ×1 IMPLANT
SUT VIC AB 3-0 PS2 18XBRD (SUTURE) ×1 IMPLANT
SUT VIC AB 3-0 SH 27X BRD (SUTURE) IMPLANT
TRAP FLUID SMOKE EVACUATOR (MISCELLANEOUS) ×1 IMPLANT
WATER STERILE IRR 500ML POUR (IV SOLUTION) ×1 IMPLANT

## 2023-07-03 NOTE — Discharge Instructions (Addendum)
 Orthopedic discharge instructions: Keep dressing dry and intact. Keep hand elevated above heart level. May shower after dressing removed on postop day 4 (Sunday). Cover sutures with Band-Aids after drying off, then reapply Velcro splint. Apply ice to affected area frequently. Take ES Tylenol or pain medication as prescribed when needed.  Return for follow-up in 10-14 days or as scheduled.

## 2023-07-03 NOTE — H&P (Signed)
 History of Present Illness:  Alice Hughes is a 68 y.o. female who presents for evaluation and treatment of a gradually enlarging soft tissue mass on the radial aspect of her right wrist. She first noted the soft tissue mass about 8 months ago. Over the past few months, she has noted that it has gradually enlarged, concerning her and prompting her to make this appointment. She denies any pain around the mass nor around the rest of her wrist or hand on today's visit, but does note occasional mild discomfort for which she will take only Tylenol  as necessary with relief. She denies any specific injury to the wrist or hand which may have precipitated the development of these symptoms, and denies any numbness or paresthesias to her hand.  Current Outpatient Medications:  alcohol swabs (ALCOHOL WIPES) PadM Apply 1 each topically 4 (four) times daily 400 each 1  allopurinoL (ZYLOPRIM) 300 MG tablet Take 1 tablet (300 mg total) by mouth once daily 90 tablet 1  colchicine (COLCRYS) 0.6 mg tablet Take 1 po q day 90 tablet 1  cyanocobalamin  (VITAMIN B12) 1000 MCG tablet TAKE 1 TABLET BY MOUTH ONCE DAILY 30 tablet 3  dulaglutide (TRULICITY) 1.5 mg/0.5 mL subcutaneous pen injector Inject 0.5 mLs (1.5 mg total) subcutaneously every 7 (seven) days 6 mL 3  empagliflozin (JARDIANCE) 10 mg tablet Take 5 mg by mouth once daily  folic acid  (FOLVITE ) 1 MG tablet 1 tab daily, 90 days - 90 tabs 90 tablet 4  FUROsemide (LASIX) 20 MG tablet Take 1 tablet (20 mg total) by mouth once daily 30 tablet 11  gabapentin (NEURONTIN) 300 MG capsule Take 1 capsule (300 mg total) by mouth 2 (two) times daily 180 capsule 1  insulin  GLARGINE (LANTUS SOLOSTAR U-100 INSULIN ) pen injector (concentration 100 units/mL) Inject 80 units nightly 180 mL 3  insulin  LISPRO (HUMALOG KWIKPEN) pen injector (concentration 100 units/mL) USE 50 UNITS BEFORE BREAKFAST AND 60 UNITS BEFORE SUPPER MEAL DAILY 90 mL 1  lisinopriL (ZESTRIL) 10 MG tablet Take 1  tablet (10 mg total) by mouth once daily 90 tablet 3  methotrexate (RHEUMATREX) 2.5 MG tablet Take 3 tablets (7.5 mg total) by mouth every 7 (seven) days 36 tablet 1  peg-electrolyte (NULYTELY) solution Use as directed. 4000 mL 0  pen needle, diabetic (BD NANO 2ND GEN PEN NEEDLE) 32 gauge x 5/32 Ndle Use 1 each 4 (four) times daily Dx e11.22 400 each 3  rosuvastatin (CRESTOR) 5 MG tablet Take 1 tablet (5 mg total) by mouth once daily 90 tablet 3   Allergies:  Iodinated Contrast Media Other (See Comments)   Past Medical History:  Diabetes with retinopathy  Gallstones  Hearing impaired  Heart failure (CMS/HHS-HCC)  Lymphedema  Morbid obesity (CMS/HHS-HCC)  Rheumatoid arthritis (CMS/HHS-HCC)  Transverse myelitis (CMS/HHS-HCC)  Type 2 diabetes mellitus (CMS/HHS-HCC)   Past Surgical History:  pneumovax 2009  CHOLECYSTECTOMY '91 (secondary to gallstones) HYSTERECTOMY VAGINAL (except for part of one ovary in 2004 for cervical and uterine cancer)   Family History:  Diabetes Father  Heart disease Father  Cancer Father  Diabetes type II Father  Arthritis Father  Gout Father  Diabetes Brother  Arthritis Brother  Diabetes Brother  Arthritis Brother  Diabetes Brother  Arthritis Brother  Diabetes Sister  Breast cancer Sister  2007  Rheum arthritis Sister  Arthritis Sister  Colon cancer Mother  Arthritis Mother   Social History:   Socioeconomic History:  Marital status: Married  Occupational History  Occupation: Not employed  Tobacco  Use  Smoking status: Never  Passive exposure: Never  Smokeless tobacco: Never  Vaping Use  Vaping status: Never Used  Substance and Sexual Activity  Alcohol use: No  Alcohol/week: 0.0 standard drinks of alcohol  Drug use: No   Social Drivers of Health:   Physicist, medical Strain: Low Risk (06/14/2023)  Overall Financial Resource Strain (CARDIA)  Difficulty of Paying Living Expenses: Not hard at all  Food Insecurity: No Food  Insecurity (06/14/2023)  Hunger Vital Sign  Worried About Running Out of Food in the Last Year: Never true  Ran Out of Food in the Last Year: Never true  Recent Concern: Food Insecurity - Food Insecurity Present (04/03/2023)  Hunger Vital Sign  Worried About Running Out of Food in the Last Year: Sometimes true  Ran Out of Food in the Last Year: Sometimes true  Transportation Needs: No Transportation Needs (06/14/2023)  PRAPARE - Risk analyst (Medical): No  Lack of Transportation (Non-Medical): No   Review of Systems:  A comprehensive 14 point ROS was performed, reviewed, and the pertinent orthopaedic findings are documented in the HPI.  Physical Exam: Vitals:  06/14/23 1406  BP: 118/70  Weight: (!) 125.2 kg (276 lb)  Height: 162.6 cm (5' 4)  PainSc: 0-No pain  PainLoc: Wrist   General/Constitutional: Pleasant significantly overweight middle-age female in no acute distress. Neuro/Psych: Normal mood and affect, oriented to person, place and time. Eyes: Non-icteric. Pupils are equal, round, and reactive to light, and exhibit synchronous movement. ENT: The patient is deaf and requires the use of a sign language interpreter for this appointment. Lymphatic: No palpable adenopathy. Respiratory: Lungs clear to auscultation, Normal chest excursion, No wheezes, and Non-labored breathing Cardiovascular: Regular rate and rhythm. No murmurs. and No edema, swelling or tenderness, except as noted in detailed exam. Integumentary: No impressive skin lesions present, except as noted in detailed exam. Musculoskeletal: Unremarkable, except as noted in detailed exam.  Right wrist/hand exam: Skin inspection of the right wrist is notable for a 2 x 3 x 1.5 cm soft tissue mass over the radial aspect of the wrist. It appears to be fluid-filled and is nontender to palpation. It is freely mobile within the subcutaneous tissues. There is no overlying erythema or ecchymosis, and it is  not pulsatile. She exhibits full active and passive range of motion of the wrist without any pain or catching. She is able to actively flex extend all digits fully without any pain or triggering. She is neurovascular intact all digits.  X-rays/MRI/Lab data:  AP, lateral, and oblique views of the right wrist are obtained. These films are notable for an intraosseous cyst in the scaphoid, but otherwise demonstrate no evidence for fractures, lytic lesions, or significant degenerative changes. There is an obvious soft tissue shadow along the radial aspect of her wrist, consistent with the observed soft tissue mass on examination.  Assessment: Ganglion of right wrist.  Synovial cyst of right wrist.   Plan: The treatment options were discussed with the patient and her husband through the use of a sign language interpreter. In addition, patient educational materials were provided regarding the diagnosis and treatment options. The patient is quite frustrated by its presence as well as concerned by the gradual enlargement of the mass, and would like to have it removed. Therefore, I have recommended excision of the soft tissue mass. The procedure was discussed with the patient, as were the potential risks (including bleeding, infection, nerve and/or blood vessel injury, persistent or recurrent pain,  recurrence of the mass, stiffness of the wrist, need for further surgery, blood clots, strokes, heart attacks and/or arhythmias, pneumonia, etc.) and benefits. The patient states her understanding and wishes to proceed. All of the patient's questions and concerns were answered. She can call any time with further concerns. She will follow up post-surgery, routine.    H&P reviewed and patient re-examined. No changes.

## 2023-07-03 NOTE — Anesthesia Preprocedure Evaluation (Addendum)
 Anesthesia Evaluation  Patient identified by MRN, date of birth, ID band Patient awake    Reviewed: Allergy & Precautions, NPO status , Patient's Chart, lab work & pertinent test results  History of Anesthesia Complications Negative for: history of anesthetic complications  Airway Mallampati: III  TM Distance: >3 FB Neck ROM: full    Dental  (+) Upper Dentures, Lower Dentures   Pulmonary neg pulmonary ROS   Pulmonary exam normal        Cardiovascular hypertension, On Medications pulmonary hypertension+CHF  Normal cardiovascular exam+ dysrhythmias (RBBB)   Patient underwent diagnostic RIGHT heart catheterization on 05/19/2019.  Hemodynamics: mean RA = 20 mmHg, mean PA = 38 mmHg, mean PCWP = 38 mmHg, PA saturation = 48%, AO saturation = 88%, PVR = 0.58 Wood units, CO = 5.14 L/min, and CI = 2.24 L/min/m.  Findings consistent with patient's known moderate pulmonary hypertension.   Most recent TTE performed on 03/07/2023 revealed a normal left ventricular systolic function with an EF of >55% %. There was mild LVH.  There were no regional wall motion abnormalities. Left ventricular diastolic Doppler parameters consistent with abnormal relaxation (G1DD). Right ventricular size and function normal with a TAPSE measuring 2.1 cm  (normal range >/= 1.6 cm).  There was trivial mitral and tricuspid valve regurgitation.  All transvalvular gradients were noted to be normal providing no evidence of hemodynamically significant valvular stenosis. Aorta normal in size with no evidence of ectasia or aneurysmal dilatation.    Neuro/Psych negative neurological ROS  negative psych ROS   GI/Hepatic Neg liver ROS, hiatal hernia,,,  Endo/Other  diabetes, Type 2, Oral Hypoglycemic Agents    Renal/GU Renal disease     Musculoskeletal  (+) Arthritis , Osteoarthritis and Rheumatoid disorders,    Abdominal   Peds  Hematology  (+) Blood dyscrasia,  anemia   Anesthesia Other Findings Past Medical History: 02/06/2023: Anemia in chronic kidney disease (CKD) No date: Arthritis No date: Chronic venous insufficiency No date: CKD (chronic kidney disease), stage III (HCC) No date: Deaf No date: Diabetic retinopathy (HCC) No date: Diverticulitis No date: Gallstones No date: Heart failure with preserved ejection fraction (HCC) No date: Hiatal hernia No date: Hyperlipidemia No date: Hypertension No date: Idiopathic chronic gout of foot without tophus No date: Long term methotrexate user No date: Lymphedema 03/03/2018: Myalgia No date: Numbness and tingling in both hands No date: Obesity No date: Pulmonary hypertension (HCC) No date: RBBB (right bundle branch block) No date: Rheumatoid arthritis (HCC)     Comment:  a.) Tx'd with long term MTX No date: Synovial cyst of right wrist No date: T2DM (type 2 diabetes mellitus) (HCC) No date: Transverse myelitis (HCC) No date: Ventral abdominal wall hernia  Past Surgical History: No date: CARDIAC CATHETERIZATION No date: CHOLECYSTECTOMY 05/07/2023: COLONOSCOPY; N/A     Comment:  Procedure: COLONOSCOPY;  Surgeon: Maryruth Ole DASEN,               MD;  Location: [facility] ENDOSCOPY;  Service: Endoscopy;                Laterality: N/A;  NEEDS SIGN INTERPRETER - TRISH               REQUESTED ASL AT 8:45 AM, MAY 6TH 05/07/2023: ESOPHAGOGASTRODUODENOSCOPY; N/A     Comment:  Procedure: EGD (ESOPHAGOGASTRODUODENOSCOPY);  Surgeon:               Maryruth Ole DASEN, MD;  Location: [facility] ENDOSCOPY;  Service: Endoscopy;  Laterality: N/A; No date: HYSTERECTOMY, VAGINAL, WITH SALPINGO-OOPHORECTOMY; N/A 05/19/2019: RIGHT HEART CATH AND CORONARY ANGIOGRAPHY; Right     Comment:  Procedure: RIGHT HEART CATH;  Surgeon: Bosie Vinie LABOR,               MD;  Location: [facility] INVASIVE CV LAB;  Service:               Cardiovascular;  Laterality: Right;     Reproductive/Obstetrics negative OB  ROS                              Anesthesia Physical Anesthesia Plan  ASA: 3  Anesthesia Plan: General LMA   Post-op Pain Management: Toradol  IV (intra-op)* and Ofirmev  IV (intra-op)*   Induction: Intravenous  PONV Risk Score and Plan: 3 and Dexamethasone, Ondansetron  and Treatment may vary due to age or medical condition  Airway Management Planned: LMA  Additional Equipment:   Intra-op Plan:   Post-operative Plan: Extubation in OR  Informed Consent: I have reviewed the patients History and Physical, chart, labs and discussed the procedure including the risks, benefits and alternatives for the proposed anesthesia with the patient or authorized representative who has indicated his/her understanding and acceptance.     Dental Advisory Given  Plan Discussed with: Anesthesiologist, CRNA and Surgeon  Anesthesia Plan Comments: (Patient consented for risks of anesthesia including but not limited to:  - adverse reactions to medications - damage to eyes, teeth, lips or other oral mucosa - nerve damage due to positioning  - sore throat or hoarseness - Damage to heart, brain, nerves, lungs, other parts of body or loss of life  Patient voiced understanding and assent.)         Anesthesia Quick Evaluation

## 2023-07-03 NOTE — Anesthesia Procedure Notes (Signed)
 Procedure Name: LMA Insertion Date/Time: 07/03/2023 12:08 PM  Performed by: Jackye Spanner, CRNAPre-anesthesia Checklist: Patient identified, Patient being monitored, Timeout performed, Emergency Drugs available and Suction available Patient Re-evaluated:Patient Re-evaluated prior to induction Oxygen Delivery Method: Circle system utilized Preoxygenation: Pre-oxygenation with 100% oxygen Induction Type: IV induction Ventilation: Mask ventilation without difficulty LMA: LMA inserted LMA Size: 4.0 Tube type: Oral Number of attempts: 1 Placement Confirmation: positive ETCO2 and breath sounds checked- equal and bilateral Tube secured with: Tape Dental Injury: Teeth and Oropharynx as per pre-operative assessment  Comments: Smooth atraumatic LMA placement, no complications noted.

## 2023-07-03 NOTE — Transfer of Care (Signed)
 Immediate Anesthesia Transfer of Care Note  Patient: Celia Friedland  Procedure(s) Performed: EXCISION MASS UPPER EXTREMITIES (Right: Wrist)  Patient Location: PACU  Anesthesia Type:General  Level of Consciousness: awake, alert , and drowsy  Airway & Oxygen Therapy: Patient Spontanous Breathing  Post-op Assessment: Report given to RN and Post -op Vital signs reviewed and stable  Post vital signs: Reviewed and stable  Last Vitals:  Vitals Value Taken Time  BP 113/63 1316  Temp 35.9 1316  Pulse 82 07/03/23 13:15  Resp 15 07/03/23 13:16  SpO2 95 % 07/03/23 13:15  Vitals shown include unfiled device data.  Last Pain: There were no vitals filed for this visit.       Complications: No notable events documented.

## 2023-07-03 NOTE — Op Note (Signed)
 07/03/2023  1:22 PM  Patient:   Alice Hughes  Pre-Op Diagnosis:   Right volar carpal ganglion.  Post-Op Diagnosis:   Same.  Procedure:   Excision of right volar carpal ganglion.  Surgeon:   DOROTHA Reyes Maltos, MD  Anesthesia:   Bier block  Findings:   As above.  Complications:   None  EBL:   1 cc  Fluids:   350 cc crystalloid  TT:   39 minutes at 275 mmHg  Drains:   None  Closure:   3-0 Vicryl subcuticular sutures  Brief Clinical Note:   The patient is a 68 year old female with a history of a gradually enlarging soft tissue mass on the volar radial aspect of her right wrist. Her symptoms have persisted despite medications, activity modification, etc. Her history and examination are consistent with a right volar carpal ganglion. The patient presents at this time for excision of the right volar carpal ganglion.  Procedure:   The patient was brought into the operating room and lain in the supine position. After adequate general laryngeal mask anesthesia was achieved, the right hand and upper extremity were prepped with ChloraPrep solution before being draped sterilely. Preoperative antibiotics were administered.  A timeout was performed to verify the appropriate surgical site before the limb was exsanguinated with an Esmarch and the tourniquet inflated to 275 mmHg.  An approximately 1.5-2 cm incision was made longitudinally over the cyst in line with the wrist extension crease. The incision was carried down through the subcutaneous tissues with care taken to identify and protect the palmar cutaneous nerve, as well as the radial artery. Once the cyst was identified, it was dissected out circumferentially, tracking it down to the stalk, before it was removed. During its removal, the cyst ruptured, releasing approximately 0.5 cc of a straw-colored gelatinous material, consistent with a ganglion cyst. The cyst was tracked down to the carpus. A small amount of capsule was removed from the  carpus in order to minimize the likelihood of recurrence.  The wound was copiously irrigated with sterile saline solution before the skin was closed using 3-0 Vicryl subcuticular sutures. Benzoin and Steri-Strips were applied to the skin before a sterile bulky dressing was applied to the wrist. A total of 10 cc of 0.5% plain Sensorcaine was injected in and around the incision to help with postoperative analgesia prior to placement of the dressing. The patient was then awakened and returned to the recovery room in satisfactory condition after tolerating the procedure well.

## 2023-07-03 NOTE — Anesthesia Postprocedure Evaluation (Signed)
 Anesthesia Post Note  Patient: Maridee Slape  Procedure(s) Performed: EXCISION MASS UPPER EXTREMITIES (Right: Wrist)  Patient location during evaluation: PACU Anesthesia Type: General Level of consciousness: awake and alert Pain management: pain level controlled Vital Signs Assessment: post-procedure vital signs reviewed and stable Respiratory status: spontaneous breathing, nonlabored ventilation, respiratory function stable and patient connected to nasal cannula oxygen Cardiovascular status: blood pressure returned to baseline and stable Postop Assessment: no apparent nausea or vomiting Anesthetic complications: no   No notable events documented.   Last Vitals:  Vitals:   07/03/23 1111 07/03/23 1315  BP: 134/75 (!) 116/53  Pulse: 85 82  Resp: 16 15  Temp: 36.9 C 36.7 C  SpO2: 97% 95%    Last Pain:  Vitals:   07/03/23 1315  PainSc: 0-No pain                 Lendia LITTIE Mae

## 2023-07-04 ENCOUNTER — Encounter: Payer: Self-pay | Admitting: *Deleted

## 2023-07-08 LAB — SURGICAL PATHOLOGY

## 2023-07-15 ENCOUNTER — Encounter: Payer: Self-pay | Admitting: *Deleted

## 2023-07-16 ENCOUNTER — Encounter
Admission: RE | Disposition: A | Discharge status: Home / Self Care | Payer: Self-pay | Source: Ambulatory Visit | Attending: Gastroenterology

## 2023-07-16 ENCOUNTER — Ambulatory Visit: Admitting: Anesthesiology

## 2023-07-16 ENCOUNTER — Ambulatory Visit
Admission: RE | Admit: 2023-07-16 | Discharge: 2023-07-16 | Disposition: A | Discharge status: Home / Self Care | Source: Ambulatory Visit | Attending: Gastroenterology | Admitting: Gastroenterology

## 2023-07-16 ENCOUNTER — Other Ambulatory Visit: Payer: Self-pay

## 2023-07-16 DIAGNOSIS — E785 Hyperlipidemia, unspecified: Secondary | ICD-10-CM | POA: Diagnosis not present

## 2023-07-16 DIAGNOSIS — K5730 Diverticulosis of large intestine without perforation or abscess without bleeding: Secondary | ICD-10-CM | POA: Diagnosis not present

## 2023-07-16 DIAGNOSIS — Z7985 Long-term (current) use of injectable non-insulin antidiabetic drugs: Secondary | ICD-10-CM | POA: Diagnosis not present

## 2023-07-16 DIAGNOSIS — D509 Iron deficiency anemia, unspecified: Secondary | ICD-10-CM | POA: Diagnosis present

## 2023-07-16 DIAGNOSIS — K641 Second degree hemorrhoids: Secondary | ICD-10-CM | POA: Insufficient documentation

## 2023-07-16 DIAGNOSIS — Z9049 Acquired absence of other specified parts of digestive tract: Secondary | ICD-10-CM | POA: Insufficient documentation

## 2023-07-16 DIAGNOSIS — D631 Anemia in chronic kidney disease: Secondary | ICD-10-CM | POA: Insufficient documentation

## 2023-07-16 DIAGNOSIS — D120 Benign neoplasm of cecum: Secondary | ICD-10-CM | POA: Insufficient documentation

## 2023-07-16 DIAGNOSIS — Z90710 Acquired absence of both cervix and uterus: Secondary | ICD-10-CM | POA: Diagnosis not present

## 2023-07-16 DIAGNOSIS — K31A0 Gastric intestinal metaplasia, unspecified: Secondary | ICD-10-CM | POA: Insufficient documentation

## 2023-07-16 DIAGNOSIS — I130 Hypertensive heart and chronic kidney disease with heart failure and stage 1 through stage 4 chronic kidney disease, or unspecified chronic kidney disease: Secondary | ICD-10-CM | POA: Diagnosis not present

## 2023-07-16 DIAGNOSIS — N183 Chronic kidney disease, stage 3 unspecified: Secondary | ICD-10-CM | POA: Diagnosis not present

## 2023-07-16 DIAGNOSIS — E1122 Type 2 diabetes mellitus with diabetic chronic kidney disease: Secondary | ICD-10-CM | POA: Diagnosis not present

## 2023-07-16 DIAGNOSIS — K2950 Unspecified chronic gastritis without bleeding: Secondary | ICD-10-CM | POA: Insufficient documentation

## 2023-07-16 DIAGNOSIS — I5032 Chronic diastolic (congestive) heart failure: Secondary | ICD-10-CM | POA: Diagnosis not present

## 2023-07-16 DIAGNOSIS — Z79899 Other long term (current) drug therapy: Secondary | ICD-10-CM | POA: Insufficient documentation

## 2023-07-16 DIAGNOSIS — D123 Benign neoplasm of transverse colon: Secondary | ICD-10-CM | POA: Diagnosis not present

## 2023-07-16 DIAGNOSIS — Z794 Long term (current) use of insulin: Secondary | ICD-10-CM | POA: Diagnosis not present

## 2023-07-16 HISTORY — PX: COLONOSCOPY: SHX5424

## 2023-07-16 HISTORY — DX: Paresthesia of skin: R20.0

## 2023-07-16 HISTORY — PX: ESOPHAGOGASTRODUODENOSCOPY: SHX5428

## 2023-07-16 HISTORY — PX: POLYPECTOMY: SHX149

## 2023-07-16 HISTORY — DX: Other bursal cyst, right wrist: M71.331

## 2023-07-16 LAB — GLUCOSE, CAPILLARY: Glucose-Capillary: 167 mg/dL — ABNORMAL HIGH (ref 70–99)

## 2023-07-16 SURGERY — COLONOSCOPY
Anesthesia: General

## 2023-07-16 MED ORDER — GLYCOPYRROLATE 0.2 MG/ML IJ SOLN
INTRAMUSCULAR | Status: AC
Start: 2023-07-16 — End: 2023-07-16
  Filled 2023-07-16: qty 1

## 2023-07-16 MED ORDER — LIDOCAINE HCL (CARDIAC) PF 100 MG/5ML IV SOSY
PREFILLED_SYRINGE | INTRAVENOUS | Status: DC | PRN
  Administered 2023-07-16: 100 mg via INTRAVENOUS

## 2023-07-16 MED ORDER — DEXMEDETOMIDINE HCL IN NACL 80 MCG/20ML IV SOLN
INTRAVENOUS | Status: AC
Start: 2023-07-16 — End: 2023-07-16
  Filled 2023-07-16: qty 20

## 2023-07-16 MED ORDER — DEXMEDETOMIDINE HCL IN NACL 80 MCG/20ML IV SOLN
INTRAVENOUS | Status: DC | PRN
  Administered 2023-07-16: 20 ug via INTRAVENOUS

## 2023-07-16 MED ORDER — LIDOCAINE HCL (PF) 2 % IJ SOLN
INTRAMUSCULAR | Status: AC
  Filled 2023-07-16: qty 5

## 2023-07-16 MED ORDER — GLYCOPYRROLATE 0.2 MG/ML IJ SOLN
INTRAMUSCULAR | Status: AC
  Filled 2023-07-16: qty 1

## 2023-07-16 MED ORDER — SODIUM CHLORIDE 0.9 % IV SOLN
INTRAVENOUS | Status: DC
Start: 2023-07-16 — End: 2023-07-16

## 2023-07-16 MED ORDER — EPHEDRINE SULFATE-NACL 50-0.9 MG/10ML-% IV SOSY
PREFILLED_SYRINGE | INTRAVENOUS | Status: DC | PRN
  Administered 2023-07-16: 10 mg via INTRAVENOUS
  Administered 2023-07-16: 15 mg via INTRAVENOUS
  Administered 2023-07-16 (×3): 10 mg via INTRAVENOUS

## 2023-07-16 MED ORDER — GLYCOPYRROLATE 0.2 MG/ML IJ SOLN
INTRAMUSCULAR | Status: DC | PRN
  Administered 2023-07-16 (×2): .2 mg via INTRAVENOUS

## 2023-07-16 MED ORDER — PROPOFOL 1000 MG/100ML IV EMUL
INTRAVENOUS | Status: AC
  Filled 2023-07-16: qty 100

## 2023-07-16 MED ORDER — PHENYLEPHRINE 80 MCG/ML (10ML) SYRINGE FOR IV PUSH (FOR BLOOD PRESSURE SUPPORT)
PREFILLED_SYRINGE | INTRAVENOUS | Status: AC
  Filled 2023-07-16: qty 10

## 2023-07-16 MED ORDER — EPHEDRINE 5 MG/ML INJ
INTRAVENOUS | Status: AC
Start: 2023-07-16 — End: 2023-07-16
  Filled 2023-07-16: qty 5

## 2023-07-16 MED ORDER — PHENYLEPHRINE 80 MCG/ML (10ML) SYRINGE FOR IV PUSH (FOR BLOOD PRESSURE SUPPORT)
PREFILLED_SYRINGE | INTRAVENOUS | Status: DC | PRN
  Administered 2023-07-16: 80 ug via INTRAVENOUS
  Administered 2023-07-16: 160 ug via INTRAVENOUS
  Administered 2023-07-16: 80 ug via INTRAVENOUS
  Administered 2023-07-16: 160 ug via INTRAVENOUS

## 2023-07-16 MED ORDER — PROPOFOL 10 MG/ML IV BOLUS
INTRAVENOUS | Status: DC | PRN
  Administered 2023-07-16 (×2): 20 mg via INTRAVENOUS
  Administered 2023-07-16: 40 mg via INTRAVENOUS
  Administered 2023-07-16: 20 mg via INTRAVENOUS
  Administered 2023-07-16: 30 mg via INTRAVENOUS
  Administered 2023-07-16: 50 mg via INTRAVENOUS

## 2023-07-16 MED ORDER — PROPOFOL 500 MG/50ML IV EMUL
INTRAVENOUS | Status: DC | PRN
  Administered 2023-07-16: 50 ug/kg/min via INTRAVENOUS

## 2023-07-16 NOTE — Interval H&P Note (Signed)
 History and Physical Interval Note:  07/16/2023 7:48 AM  Alice Hughes  has presented today for surgery, with the diagnosis of IDA.  The various methods of treatment have been discussed with the patient and family. After consideration of risks, benefits and other options for treatment, the patient has consented to  Procedure(s) with comments: COLONOSCOPY (N/A) - IDDM & TRULICITY,  SIGN LANGUAGE - 1ST CASE EGD (ESOPHAGOGASTRODUODENOSCOPY) (N/A) as a surgical intervention.  The patient's history has been reviewed, patient examined, no change in status, stable for surgery.  I have reviewed the patient's chart and labs.  Questions were answered to the patient's satisfaction.     [name]  Ok to proceed with EGD/Colonoscopy

## 2023-07-16 NOTE — Op Note (Signed)
 [facility] Patient Name: Alice Hughes Procedure Date: 07/16/2023 7:28 AM MRN: [id number] Account #: [id number] Date of Birth: 1955/06/07 Admit Type: Outpatient Age: 68 Room: [facility] ENDO ROOM 1 Gender: Female Note Status: Finalized Instrument Name: Barnie Endoscope [id number] Procedure:             Upper GI endoscopy Indications:           Iron  deficiency anemia Providers:             Ole Schick MD, MD Medicines:             Monitored Anesthesia Care Complications:         No immediate complications. Estimated blood loss:                         Minimal. Procedure:             Pre-Anesthesia Assessment:                        - Prior to the procedure, a History and Physical was                         performed, and patient medications and allergies were                         reviewed. The patient is competent. The risks and                         benefits of the procedure and the sedation options and                         risks were discussed with the patient. All questions                         were answered and informed consent was obtained.                         Patient identification and proposed procedure were                         verified by the physician, the nurse, the                         anesthesiologist, the anesthetist and the technician                         in the endoscopy suite. Mental Status Examination:                         alert and oriented. Airway Examination: normal                         oropharyngeal airway and neck mobility. Respiratory                         Examination: clear to auscultation. CV Examination:                         normal. Prophylactic Antibiotics: The patient does not  require prophylactic antibiotics. Prior                         Anticoagulants: The patient has taken no anticoagulant                         or antiplatelet agents. ASA Grade Assessment:  III - A                         patient with severe systemic disease. After reviewing                         the risks and benefits, the patient was deemed in                         satisfactory condition to undergo the procedure. The                         anesthesia plan was to use monitored anesthesia care                         (MAC). Immediately prior to administration of                         medications, the patient was re-assessed for adequacy                         to receive sedatives. The heart rate, respiratory                         rate, oxygen saturations, blood pressure, adequacy of                         pulmonary ventilation, and response to care were                         monitored throughout the procedure. The physical                         status of the patient was re-assessed after the                         procedure.                        After obtaining informed consent, the endoscope was                         passed under direct vision. Throughout the procedure,                         the patient's blood pressure, pulse, and oxygen                         saturations were monitored continuously. The Endoscope                         was introduced through the mouth, and advanced to the  second part of duodenum. The upper GI endoscopy was                         accomplished without difficulty. The patient tolerated                         the procedure well. Findings:      The examined esophagus was normal.      Patchy moderate inflammation characterized by adherent blood and       erythema was found in the gastric antrum. Biopsies were taken with a       cold forceps for Helicobacter pylori testing. Estimated blood loss was       minimal.      The examined duodenum was normal. Impression:            - Normal esophagus.                        - Gastritis. Biopsied.                        - Normal examined  duodenum. Recommendation:        - Discharge patient to home.                        - Resume previous diet.                        - Continue present medications.                        - Await pathology results.                        - Return to referring physician as previously                         scheduled. Procedure Code(s):     --- Professional ---                        [phone], Esophagogastroduodenoscopy, flexible,                         transoral; with biopsy, single or multiple Diagnosis Code(s):     --- Professional ---                        K29.70, Gastritis, unspecified, without bleeding                        D50.9, Iron  deficiency anemia, unspecified CPT copyright 2022 American [employer]. All rights reserved. The codes documented in this report are preliminary and upon coder review may  be revised to meet current compliance requirements. Ole Schick MD, MD 07/16/2023 8:51:57 AM Number of Addenda: 0 Note Initiated On: 07/16/2023 7:28 AM Estimated Blood Loss:  Estimated blood loss was minimal.      [facility]

## 2023-07-16 NOTE — Anesthesia Preprocedure Evaluation (Signed)
 Anesthesia Evaluation  Patient identified by MRN, date of birth, ID band Patient awake    Reviewed: Allergy & Precautions, H&P , NPO status , Patient's Chart, lab work & pertinent test results, reviewed documented beta blocker date and time   Airway Mallampati: II   Neck ROM: full    Dental  (+) Poor Dentition   Pulmonary neg pulmonary ROS   Pulmonary exam normal        Cardiovascular Exercise Tolerance: Poor hypertension, On Medications Normal cardiovascular exam+ dysrhythmias  Rhythm:regular Rate:Normal     Neuro/Psych  Neuromuscular disease  negative psych ROS   GI/Hepatic Neg liver ROS, hiatal hernia,,,  Endo/Other  negative endocrine ROSdiabetes    Renal/GU Renal disease  negative genitourinary   Musculoskeletal   Abdominal   Peds  Hematology  (+) Blood dyscrasia, anemia   Anesthesia Other Findings Past Medical History: 02/06/2023: Anemia in chronic kidney disease (CKD) No date: Arthritis No date: Chronic venous insufficiency No date: CKD (chronic kidney disease), stage III (HCC) No date: Deaf No date: Diabetic retinopathy (HCC) No date: Diverticulitis No date: Gallstones No date: Generalized osteoarthritis No date: Hearing impaired person, bilateral No date: Heart failure with preserved ejection fraction (HCC) No date: Hiatal hernia No date: Hyperlipidemia No date: Hypertension No date: Idiopathic chronic gout of foot without tophus No date: Long term methotrexate user No date: Lymphedema 03/03/2018: Myalgia No date: Numbness and tingling in both hands No date: Obesity No date: Pulmonary hypertension (HCC) No date: RBBB (right bundle branch block) No date: Rheumatoid arthritis (HCC)     Comment:  a.) Tx'd with long term MTX No date: Synovial cyst of right wrist No date: T2DM (type 2 diabetes mellitus) (HCC) No date: Transverse myelitis (HCC) No date: Ventral abdominal wall hernia Past  Surgical History: No date: CARDIAC CATHETERIZATION No date: CHOLECYSTECTOMY 05/07/2023: COLONOSCOPY; N/A     Comment:  Procedure: COLONOSCOPY;  Surgeon: Maryruth Ole DASEN,               MD;  Location: [facility] ENDOSCOPY;  Service: Endoscopy;                Laterality: N/A;  NEEDS SIGN INTERPRETER - TRISH               REQUESTED ASL AT 8:45 AM, MAY 6TH 05/07/2023: ESOPHAGOGASTRODUODENOSCOPY; N/A     Comment:  Procedure: EGD (ESOPHAGOGASTRODUODENOSCOPY);  Surgeon:               Maryruth Ole DASEN, MD;  Location: [facility] ENDOSCOPY;                Service: Endoscopy;  Laterality: N/A; 07/03/2023: EXCISION MASS UPPER EXTREMETIES; Right     Comment:  Procedure: EXCISION MASS UPPER EXTREMITIES;  Surgeon:               Edie Norleen PARAS, MD;  Location: [facility] ORS;  Service:               Orthopedics;  Laterality: Right; No date: HYSTERECTOMY, VAGINAL, WITH SALPINGO-OOPHORECTOMY; N/A 05/19/2019: RIGHT HEART CATH AND CORONARY ANGIOGRAPHY; Right     Comment:  Procedure: RIGHT HEART CATH;  Surgeon: Bosie Vinie LABOR,               MD;  Location: [facility] INVASIVE CV LAB;  Service:               Cardiovascular;  Laterality: Right;   Reproductive/Obstetrics negative OB ROS  Anesthesia Physical Anesthesia Plan  ASA: 3  Anesthesia Plan: General   Post-op Pain Management:    Induction:   PONV Risk Score and Plan:   Airway Management Planned:   Additional Equipment:   Intra-op Plan:   Post-operative Plan:   Informed Consent: I have reviewed the patients History and Physical, chart, labs and discussed the procedure including the risks, benefits and alternatives for the proposed anesthesia with the patient or authorized representative who has indicated his/her understanding and acceptance.     Dental Advisory Given  Plan Discussed with: CRNA  Anesthesia Plan Comments:         Anesthesia Quick Evaluation

## 2023-07-16 NOTE — Op Note (Signed)
 [facility] Gastroenterology Patient Name: Alice Hughes Procedure Date: 07/16/2023 7:28 AM MRN: [id number] Account #: [id number] Date of Birth: [birth year] Admit Type: Outpatient Age: 68 Room: [facility] ENDO ROOM 1 Gender: Female Note Status: Finalized Instrument Name: Arvis [id number] Procedure:             Colonoscopy Indications:           Iron  deficiency anemia Providers:             Ole Schick MD, MD Medicines:             Monitored Anesthesia Care Complications:         No immediate complications. Estimated blood loss:                         Minimal. Procedure:             Pre-Anesthesia Assessment:                        - Prior to the procedure, a History and Physical was                         performed, and patient medications and allergies were                         reviewed. The patient is competent. The risks and                         benefits of the procedure and the sedation options and                         risks were discussed with the patient. All questions                         were answered and informed consent was obtained.                         Patient identification and proposed procedure were                         verified by the physician, the nurse, the                         anesthesiologist, the anesthetist and the technician                         in the endoscopy suite. Mental Status Examination:                         alert and oriented. Airway Examination: normal                         oropharyngeal airway and neck mobility. Respiratory                         Examination: clear to auscultation. CV Examination:                         normal. Prophylactic Antibiotics: The patient does not  require prophylactic antibiotics. Prior                         Anticoagulants: The patient has taken no anticoagulant                         or antiplatelet agents. ASA Grade Assessment: III - A                          patient with severe systemic disease. After reviewing                         the risks and benefits, the patient was deemed in                         satisfactory condition to undergo the procedure. The                         anesthesia plan was to use monitored anesthesia care                         (MAC). Immediately prior to administration of                         medications, the patient was re-assessed for adequacy                         to receive sedatives. The heart rate, respiratory                         rate, oxygen saturations, blood pressure, adequacy of                         pulmonary ventilation, and response to care were                         monitored throughout the procedure. The physical                         status of the patient was re-assessed after the                         procedure.                        After obtaining informed consent, the colonoscope was                         passed under direct vision. Throughout the procedure,                         the patient's blood pressure, pulse, and oxygen                         saturations were monitored continuously. The                         Colonoscope was introduced through the anus and  advanced to the the cecum, identified by appendiceal                         orifice and ileocecal valve. The colonoscopy was                         technically difficult and complex due to bowel                         stenosis. Successful completion of the procedure was                         aided by withdrawing the scope and replacing with the                         pediatric colonoscope. The patient tolerated the                         procedure well. The quality of the bowel preparation                         was fair. The ileocecal valve, appendiceal orifice,                         and rectum were photographed. Findings:      The perianal and digital  rectal examinations were normal.      A 3 mm polyp was found in the appendiceal orifice. The polyp was       sessile. The polyp was removed with a cold snare. Resection and       retrieval were complete. Estimated blood loss was minimal.      Scattered large-mouthed and small-mouthed diverticula were found in the       sigmoid colon, descending colon, transverse colon, ascending colon and       cecum.      A 2 mm polyp was found in the transverse colon. The polyp was sessile.       The polyp was removed with a cold snare. Resection and retrieval were       complete. Estimated blood loss was minimal.      Internal hemorrhoids were found during retroflexion. The hemorrhoids       were Grade II (internal hemorrhoids that prolapse but reduce       spontaneously).      The exam was otherwise without abnormality on direct and retroflexion       views. Impression:            - Preparation of the colon was fair.                        - One 3 mm polyp at the appendiceal orifice, removed                         with a cold snare. Resected and retrieved.                        - Diverticulosis in the sigmoid colon, in the                         descending  colon, in the transverse colon, in the                         ascending colon and in the cecum.                        - One 2 mm polyp in the transverse colon, removed with                         a cold snare. Resected and retrieved.                        - Internal hemorrhoids.                        - The examination was otherwise normal on direct and                         retroflexion views. Recommendation:        - Discharge patient to home.                        - Resume previous diet.                        - Continue present medications.                        - Await pathology results.                        - Repeat colonoscopy in 2-3 years because the bowel                         preparation was suboptimal.                         - Return to referring physician as previously                         scheduled. Procedure Code(s):     --- Professional ---                        [phone], Colonoscopy, flexible; with removal of                         tumor(s), polyp(s), or other lesion(s) by snare                         technique Diagnosis Code(s):     --- Professional ---                        K64.1, Second degree hemorrhoids                        D12.1, Benign neoplasm of appendix                        D12.3, Benign neoplasm of transverse colon (hepatic  flexure or splenic flexure)                        D50.9, Iron  deficiency anemia, unspecified                        K57.30, Diverticulosis of large intestine without                         perforation or abscess without bleeding CPT copyright 2022 American [employer] Association. All rights reserved. The codes documented in this report are preliminary and upon coder review may  be revised to meet current compliance requirements. Ole Schick MD, MD 07/16/2023 8:56:22 AM Number of Addenda: 0 Note Initiated On: 07/16/2023 7:28 AM Scope Withdrawal Time: 0 hours 14 minutes 29 seconds  Total Procedure Duration: 0 hours 38 minutes 21 seconds  Estimated Blood Loss:  Estimated blood loss was minimal.      [facility]

## 2023-07-16 NOTE — H&P (Signed)
 Outpatient short stay form Pre-procedure 07/16/2023  Alice ONEIDA Schick, MD  Primary Physician: Valora Agent, MD  Reason for visit:  IDA  History of present illness:    68 y/o lady with history of DM II, obesity, CKD, and HLD here for EGD/Colonoscopy for IDA. Last colonoscopy was in 2011 and was normal. No blood thinners. History of hysterectomy, cholecystectomy, and c-section. No family history of GI malignancies.    Current Facility-Administered Medications:    0.9 %  sodium chloride  infusion, , Intravenous, Continuous, Amere Iott, Alice ONEIDA, MD  Facility-Administered Medications Ordered in Other Encounters:    sodium chloride  flush (NS) 0.9 % injection 3 mL, 3 mL, Intravenous, Q12H, Fath, Vinie LABOR, MD  Medications Prior to Admission  Medication Sig Dispense Refill Last Dose/Taking   acetaminophen  (TYLENOL ) 500 MG tablet Take 500-1,000 mg by mouth every 6 (six) hours as needed (pain.).   07/15/2023   allopurinol (ZYLOPRIM) 300 MG tablet Take 300 mg by mouth in the morning.   07/15/2023   colchicine 0.6 MG tablet Take 0.6 mg by mouth in the morning.   07/15/2023   cyanocobalamin  (VITAMIN B12) 1000 MCG tablet Take 1,000 mcg by mouth daily at 12 noon.   Past Week   Dulaglutide (TRULICITY) 0.75 MG/0.5ML SOAJ Inject 0.75 mg into the skin every Saturday.   Past Week   folic acid  (FOLVITE ) 1 MG tablet Take 1 mg by mouth in the morning.   Past Week   furosemide (LASIX) 20 MG tablet Take 20 mg by mouth in the morning.   07/15/2023   gabapentin (NEURONTIN) 300 MG capsule Take 300 mg by mouth 2 (two) times daily.   07/15/2023   insulin  lispro (HUMALOG) 100 UNIT/ML KwikPen Inject 50-60 Units into the skin See admin instructions. Inject 50 units subcutaneously in the morning & inject 60 units subcutaneously at bedtime.   07/15/2023   Iron -Vitamin C  65-125 MG TABS Take 1 tablet by mouth daily.   Past Week   JARDIANCE 10 MG TABS tablet Take 5 mg by mouth in the morning.   Past Week   LANTUS SOLOSTAR  100 UNIT/ML Solostar Pen Inject 80 Units into the skin at bedtime.   07/15/2023   methotrexate (RHEUMATREX) 2.5 MG tablet Take 7.5 mg by mouth every Friday.   07/15/2023   rosuvastatin (CRESTOR) 5 MG tablet Take 5 mg by mouth in the morning.   07/15/2023   HYDROcodone -acetaminophen  (NORCO/VICODIN) 5-325 MG tablet Take 1 tablet by mouth every 4 (four) hours as needed for moderate pain (pain score 4-6) or severe pain (pain score 7-10). 20 tablet 0    lisinopril (ZESTRIL) 10 MG tablet Take 10 mg by mouth in the morning.        Allergies  Allergen Reactions   Iodinated Contrast Media Other (See Comments)    Per pt she has hx of dye allergy and has iodinated contrast media allergy listed in [facility] chart. MSY      Past Medical History:  Diagnosis Date   Anemia in chronic kidney disease (CKD) 02/06/2023   Arthritis    Chronic venous insufficiency    CKD (chronic kidney disease), stage III (HCC)    Deaf    Diabetic retinopathy (HCC)    Diverticulitis    Gallstones    Generalized osteoarthritis    Hearing impaired person, bilateral    Heart failure with preserved ejection fraction (HCC)    Hiatal hernia    Hyperlipidemia    Hypertension    Idiopathic chronic gout of  foot without tophus    Long term methotrexate user    Lymphedema    Myalgia 03/03/2018   Numbness and tingling in both hands    Obesity    Pulmonary hypertension (HCC)    RBBB (right bundle branch block)    Rheumatoid arthritis (HCC)    a.) Tx'd with long term MTX   Synovial cyst of right wrist    T2DM (type 2 diabetes mellitus) (HCC)    Transverse myelitis (HCC)    Ventral abdominal wall hernia     Review of systems:  Otherwise negative.    Physical Exam  Gen: Alert, oriented. Appears stated age.  HEENT: PERRLA. Lungs: No respiratory distress CV: RRR Abd: soft, benign, no masses Ext: No edema    Planned procedures: Proceed with EGD/colonoscopy. The patient understands the nature of the planned procedure,  indications, risks, alternatives and potential complications including but not limited to bleeding, infection, perforation, damage to internal organs and possible oversedation/side effects from anesthesia. The patient agrees and gives consent to proceed.  Please refer to procedure notes for findings, recommendations and patient disposition/instructions.     Alice ONEIDA Schick, MD [facility] Gastroenterology

## 2023-07-16 NOTE — Transfer of Care (Signed)
 Immediate Anesthesia Transfer of Care Note  Patient: Alice Hughes  Procedure(s) Performed: COLONOSCOPY EGD (ESOPHAGOGASTRODUODENOSCOPY) POLYPECTOMY, INTESTINE  Patient Location: PACU  Anesthesia Type:General  Level of Consciousness: sedated  Airway & Oxygen Therapy: Patient Spontanous Breathing  Post-op Assessment: Report given to RN and Post -op Vital signs reviewed and stable  Post vital signs: Reviewed and stable  Last Vitals:  Vitals Value Taken Time  BP    Temp    Pulse    Resp    SpO2      Last Pain:  Vitals:   07/16/23 0734  TempSrc: Temporal  PainSc: 0-No pain         Complications: No notable events documented.

## 2023-07-17 ENCOUNTER — Encounter: Payer: Self-pay | Admitting: Gastroenterology

## 2023-07-17 LAB — SURGICAL PATHOLOGY

## 2023-07-17 NOTE — Anesthesia Postprocedure Evaluation (Signed)
 Anesthesia Post Note  Patient: Roselle Norton  Procedure(s) Performed: COLONOSCOPY EGD (ESOPHAGOGASTRODUODENOSCOPY) POLYPECTOMY, INTESTINE  Patient location during evaluation: PACU Anesthesia Type: General Level of consciousness: awake and alert Pain management: pain level controlled Vital Signs Assessment: post-procedure vital signs reviewed and stable Respiratory status: spontaneous breathing, nonlabored ventilation, respiratory function stable and patient connected to nasal cannula oxygen Cardiovascular status: blood pressure returned to baseline and stable Postop Assessment: no apparent nausea or vomiting Anesthetic complications: no   No notable events documented.   Last Vitals:  Vitals:   07/16/23 0903 07/16/23 0910  BP: (!) 111/48 (!) (P) 111/45  Pulse: 98 98  Resp: 16 (!) 21  Temp:    SpO2: 99% 100%    Last Pain:  Vitals:   07/16/23 0859  TempSrc: Temporal  PainSc:                  Lynwood KANDICE Clause

## 2023-07-22 ENCOUNTER — Ambulatory Visit: Admitting: Dietician

## 2023-09-06 DIAGNOSIS — E1122 Type 2 diabetes mellitus with diabetic chronic kidney disease: Secondary | ICD-10-CM | POA: Insufficient documentation

## 2023-09-06 DIAGNOSIS — N390 Urinary tract infection, site not specified: Secondary | ICD-10-CM | POA: Diagnosis not present

## 2023-09-06 DIAGNOSIS — E860 Dehydration: Secondary | ICD-10-CM | POA: Diagnosis not present

## 2023-09-06 DIAGNOSIS — R739 Hyperglycemia, unspecified: Secondary | ICD-10-CM | POA: Diagnosis present

## 2023-09-06 DIAGNOSIS — D72829 Elevated white blood cell count, unspecified: Secondary | ICD-10-CM | POA: Diagnosis not present

## 2023-09-06 DIAGNOSIS — I129 Hypertensive chronic kidney disease with stage 1 through stage 4 chronic kidney disease, or unspecified chronic kidney disease: Secondary | ICD-10-CM | POA: Insufficient documentation

## 2023-09-06 DIAGNOSIS — N189 Chronic kidney disease, unspecified: Secondary | ICD-10-CM | POA: Diagnosis not present

## 2023-09-07 ENCOUNTER — Encounter: Payer: Self-pay | Admitting: Oncology

## 2023-09-07 ENCOUNTER — Other Ambulatory Visit: Payer: Self-pay

## 2023-09-07 ENCOUNTER — Emergency Department
Admission: EM | Admit: 2023-09-07 | Discharge: 2023-09-07 | Disposition: A | Discharge status: Home / Self Care | Attending: Emergency Medicine | Admitting: Emergency Medicine

## 2023-09-07 DIAGNOSIS — N390 Urinary tract infection, site not specified: Secondary | ICD-10-CM

## 2023-09-07 DIAGNOSIS — R739 Hyperglycemia, unspecified: Secondary | ICD-10-CM

## 2023-09-07 DIAGNOSIS — N179 Acute kidney failure, unspecified: Secondary | ICD-10-CM

## 2023-09-07 DIAGNOSIS — E860 Dehydration: Secondary | ICD-10-CM

## 2023-09-07 DIAGNOSIS — E1122 Type 2 diabetes mellitus with diabetic chronic kidney disease: Secondary | ICD-10-CM | POA: Diagnosis not present

## 2023-09-07 LAB — CBC
HCT: 39.7 % (ref 36.0–46.0)
Hemoglobin: 13.2 g/dL (ref 12.0–15.0)
MCH: 34.7 pg — ABNORMAL HIGH (ref 26.0–34.0)
MCHC: 33.2 g/dL (ref 30.0–36.0)
MCV: 104.5 fL — ABNORMAL HIGH (ref 80.0–100.0)
Platelets: 205 K/uL (ref 150–400)
RBC: 3.8 MIL/uL — ABNORMAL LOW (ref 3.87–5.11)
RDW: 15.4 % (ref 11.5–15.5)
WBC: 11.4 K/uL — ABNORMAL HIGH (ref 4.0–10.5)
nRBC: 0 % (ref 0.0–0.2)

## 2023-09-07 LAB — BASIC METABOLIC PANEL WITH GFR
Anion gap: 14 (ref 5–15)
BUN: 52 mg/dL — ABNORMAL HIGH (ref 8–23)
CO2: 22 mmol/L (ref 22–32)
Calcium: 9.2 mg/dL (ref 8.9–10.3)
Chloride: 94 mmol/L — ABNORMAL LOW (ref 98–111)
Creatinine, Ser: 2.1 mg/dL — ABNORMAL HIGH (ref 0.44–1.00)
GFR, Estimated: 25 mL/min — ABNORMAL LOW (ref >60)
Glucose, Bld: 296 mg/dL — ABNORMAL HIGH (ref 70–99)
Potassium: 3.7 mmol/L (ref 3.5–5.1)
Sodium: 130 mmol/L — ABNORMAL LOW (ref 135–145)

## 2023-09-07 LAB — URINALYSIS, ROUTINE W REFLEX MICROSCOPIC
Bilirubin Urine: NEGATIVE
Glucose, UA: >=500 mg/dL — AB
Hgb urine dipstick: NEGATIVE
Ketones, ur: NEGATIVE mg/dL
Nitrite: NEGATIVE
Protein, ur: 30 mg/dL — AB
Specific Gravity, Urine: 1.01 (ref 1.005–1.030)
Squamous Epithelial / HPF: 0 /HPF (ref 0–5)
pH: 5 (ref 5.0–8.0)

## 2023-09-07 LAB — CBG MONITORING, ED
Glucose-Capillary: 306 mg/dL — ABNORMAL HIGH (ref 70–99)
Glucose-Capillary: 308 mg/dL — ABNORMAL HIGH (ref 70–99)

## 2023-09-07 MED ORDER — SODIUM CHLORIDE 0.9 % IV BOLUS
1000.0000 mL | Freq: Once | INTRAVENOUS | Status: AC
  Administered 2023-09-07: 1000 mL via INTRAVENOUS

## 2023-09-07 MED ORDER — SODIUM CHLORIDE 0.9 % IV SOLN
1.0000 g | Freq: Once | INTRAVENOUS | Status: AC
  Administered 2023-09-07: 1 g via INTRAVENOUS
  Filled 2023-09-07: qty 10

## 2023-09-07 MED ORDER — CEPHALEXIN 500 MG PO CAPS: 500.0000 mg | ORAL_CAPSULE | Freq: Four times a day (QID) | ORAL | 0 refills | Status: AC

## 2023-09-07 NOTE — ED Notes (Signed)
 Reassess pt VS and collected CBG.With the help of a ASL interpreter pt does not have any needs at this, provided another warm blanket. Updated VS.

## 2023-09-07 NOTE — ED Notes (Signed)
 This RN went over d/c paper work using the [employer]. No further questions asked or concerns presented. Pt and spouse at bedside verbalized understanding.

## 2023-09-07 NOTE — ED Provider Notes (Signed)
 [facility] Provider Note    Event Date/Time   First MD Initiated Contact with Patient 09/07/23 [phone]     (approximate)   History   Hyperglycemia   HPI  Alice Hughes is a 68 y.o. female   Past medical history of poorly controlled type II diabetic, hypertension hyperlipidemia, CKD, here with high blood sugars at home.  Fingerstick blood sugars in the 3-400s.  Polyuria and polydipsia.  She denies any focal complaints or pain including chest pain, shortness of breath, respiratory infectious symptoms, GI symptoms, but has been urinating frequently.  No flank pain or fever.  Denies abdominal pain.  She states that her A1c was markedly elevated at her last appointment and she is to start a new diabetic injection medication this weekend.  Interview performed using ASL interpreter   External Medical Documents Reviewed: Previous nephrology note from earlier this summer      Physical Exam   Triage Vital Signs: ED Triage Vitals  Encounter Vitals Group     BP 09/07/23 0009 (!) 127/59     Girls Systolic BP Percentile --      Girls Diastolic BP Percentile --      Boys Systolic BP Percentile --      Boys Diastolic BP Percentile --      Pulse Rate 09/07/23 0009 86     Resp 09/07/23 0009 (!) 22     Temp 09/07/23 0009 (!) 97.5 F (36.4 C)     Temp src --      SpO2 09/07/23 0009 99 %     Weight 09/07/23 0008 275 lb (124.7 kg)     Height --      Head Circumference --      Peak Flow --      Pain Score --      Pain Loc --      Pain Education --      Exclude from Growth Chart --     Most recent vital signs: Vitals:   09/07/23 0009  BP: (!) 127/59  Pulse: 86  Resp: (!) 22  Temp: (!) 97.5 F (36.4 C)  SpO2: 99%    General: Awake, no distress.  CV:  Good peripheral perfusion.  Resp:  Normal effort.  Abd:  No distention.  Other:  Awake alert comfortable appearing, nontoxic appearing.  Communicating via sign language interpreter.  Soft benign  abdominal exam no CVA tenderness no fever.  Clear lungs without focality or wheezing.   ED Results / Procedures / Treatments   Labs (all labs ordered are listed, but only abnormal results are displayed) Labs Reviewed  BASIC METABOLIC PANEL WITH GFR - Abnormal; Notable for the following components:      Result Value   Sodium 130 (*)    Chloride 94 (*)    Glucose, Bld 296 (*)    BUN 52 (*)    Creatinine, Ser 2.10 (*)    GFR, Estimated 25 (*)    All other components within normal limits  CBC - Abnormal; Notable for the following components:   WBC 11.4 (*)    RBC 3.80 (*)    MCV 104.5 (*)    MCH 34.7 (*)    All other components within normal limits  URINALYSIS, ROUTINE W REFLEX MICROSCOPIC - Abnormal; Notable for the following components:   Color, Urine YELLOW (*)    APPearance CLOUDY (*)    Glucose, UA >=500 (*)    Protein, ur 30 (*)  Leukocytes,Ua LARGE (*)    Bacteria, UA MANY (*)    All other components within normal limits  CBG MONITORING, ED - Abnormal; Notable for the following components:   Glucose-Capillary 306 (*)    All other components within normal limits  URINE CULTURE  CBG MONITORING, ED  CBG MONITORING, ED     I ordered and reviewed the above labs they are notable for hyperglycemia but no evidence of DKA.  Creatinine is high today at 2.1.  Bacteria and leukocytes in the urine -culture ordered   PROCEDURES:  Critical Care performed: No  Procedures   MEDICATIONS ORDERED IN ED: Medications  sodium chloride  0.9 % bolus 1,000 mL (has no administration in time range)  cefTRIAXone  (ROCEPHIN ) 1 g in sodium chloride  0.9 % 100 mL IVPB (has no administration in time range)    IMPRESSION / MDM / ASSESSMENT AND PLAN / ED COURSE  I reviewed the triage vital signs and the nursing notes.                                Patient's presentation is most consistent with acute presentation with potential threat to life or bodily function.  Differential  diagnosis includes, but is not limited to, hyperglycemia, infection, DKA, dehydration   The patient is on the cardiac monitor to evaluate for evidence of arrhythmia and/or significant heart rate changes.  MDM:    Patient is here with polyuria polydipsia and hyperglycemia and known to be a poorly controlled type II diabetic with increasing diabetes medications to start over the weekend.  Her creatinine is slightly worse than her previous, will give fluids, give antibiotics given her UA indicative of urine infection, but fortunately her hyperglycemia is not associated with DKA today.  She looks very well and nontoxic with no flank pain or fever so I doubt sepsis or pyelonephritis.  I do believe that she needs very close follow-up as an outpatient given her comorbid conditions to recheck her kidney function as well as for ongoing diabetes management.  She is given a prescription for Keflex .  She knows to call her nephrologist soon as possible for an appointment and to recheck kidney function.  I considered hospitalization for admission or observation however given adequate follow-up, no evidence of sepsis, and improving blood sugars, I think she can continue her management as an outpatient with close follow-up appointments.        FINAL CLINICAL IMPRESSION(S) / ED DIAGNOSES   Final diagnoses:  Hyperglycemia  AKI (acute kidney injury) (HCC)  Dehydration  Lower urinary tract infectious disease     Rx / DC Orders   ED Discharge Orders          Ordered    cephALEXin  (KEFLEX ) 500 MG capsule  4 times daily        09/07/23 0453             Note:  This document was prepared using Dragon voice recognition software and may include unintentional dictation errors.    Cyrena Mylar, MD 09/07/23 [phone]

## 2023-09-07 NOTE — ED Triage Notes (Signed)
 Pt arrives with c/o hyperglycemia. Per pt, she checked her BG and it was in the 300s and 400s. Pt unsure if her meter and strips are good. Pt reports dizziness. Pt denies ABD pain or n/v.

## 2023-09-07 NOTE — Discharge Instructions (Addendum)
 Take antibiotics for the full 5-day course as prescribed to help with your urinary tract infection.  Drink plenty of fluids to stay well-hydrated.  Have your doctor recheck your kidney lab tests -talk with your kidney doctor for an appointment as soon as possible.  Talk to your regular doctor about diabetes management.  Thank you for choosing us  for your health care today!  Please see your primary doctor this week for a follow up appointment.   If you have any new, worsening, or unexpected symptoms call your doctor right away or come back to the emergency department for reevaluation.  It was my pleasure to care for you today.   Ginnie EDISON Cyrena, MD

## 2023-09-08 LAB — URINE CULTURE

## 2023-11-15 ENCOUNTER — Inpatient Hospital Stay: Attending: Oncology

## 2023-11-15 DIAGNOSIS — Z803 Family history of malignant neoplasm of breast: Secondary | ICD-10-CM | POA: Insufficient documentation

## 2023-11-15 DIAGNOSIS — N189 Chronic kidney disease, unspecified: Secondary | ICD-10-CM | POA: Insufficient documentation

## 2023-11-15 DIAGNOSIS — Z79899 Other long term (current) drug therapy: Secondary | ICD-10-CM | POA: Insufficient documentation

## 2023-11-15 DIAGNOSIS — D508 Other iron deficiency anemias: Secondary | ICD-10-CM

## 2023-11-15 DIAGNOSIS — D509 Iron deficiency anemia, unspecified: Secondary | ICD-10-CM | POA: Insufficient documentation

## 2023-11-15 DIAGNOSIS — M069 Rheumatoid arthritis, unspecified: Secondary | ICD-10-CM | POA: Insufficient documentation

## 2023-11-15 DIAGNOSIS — D631 Anemia in chronic kidney disease: Secondary | ICD-10-CM | POA: Insufficient documentation

## 2023-11-15 LAB — CBC WITH DIFFERENTIAL (CANCER CENTER ONLY)
Abs Immature Granulocytes: 0.06 K/uL (ref 0.00–0.07)
Basophils Absolute: 0.1 K/uL (ref 0.0–0.1)
Basophils Relative: 1 %
Eosinophils Absolute: 0.6 K/uL — ABNORMAL HIGH (ref 0.0–0.5)
Eosinophils Relative: 7 %
HCT: 36.1 % (ref 36.0–46.0)
Hemoglobin: 11.7 g/dL — ABNORMAL LOW (ref 12.0–15.0)
Immature Granulocytes: 1 %
Lymphocytes Relative: 31 %
Lymphs Abs: 2.6 K/uL (ref 0.7–4.0)
MCH: 34.4 pg — ABNORMAL HIGH (ref 26.0–34.0)
MCHC: 32.4 g/dL (ref 30.0–36.0)
MCV: 106.2 fL — ABNORMAL HIGH (ref 80.0–100.0)
Monocytes Absolute: 0.7 K/uL (ref 0.1–1.0)
Monocytes Relative: 8 %
Neutro Abs: 4.5 K/uL (ref 1.7–7.7)
Neutrophils Relative %: 52 %
Platelet Count: 191 K/uL (ref 150–400)
RBC: 3.4 MIL/uL — ABNORMAL LOW (ref 3.87–5.11)
RDW: 14.8 % (ref 11.5–15.5)
WBC Count: 8.4 K/uL (ref 4.0–10.5)
nRBC: 0 % (ref 0.0–0.2)

## 2023-11-15 LAB — IRON AND TIBC
Iron: 51 ug/dL (ref 28–170)
Saturation Ratios: 14 % (ref 10.4–31.8)
TIBC: 372 ug/dL (ref 250–450)
UIBC: 322 ug/dL

## 2023-11-15 LAB — RETIC PANEL
Immature Retic Fract: 32.5 % — ABNORMAL HIGH (ref 2.3–15.9)
RBC.: 3.42 MIL/uL — ABNORMAL LOW (ref 3.87–5.11)
Retic Count, Absolute: 89.9 K/uL (ref 19.0–186.0)
Retic Ct Pct: 2.6 % (ref 0.4–3.1)
Reticulocyte Hemoglobin: 37 pg (ref >27.9)

## 2023-11-15 LAB — FERRITIN: Ferritin: 137 ng/mL (ref 11–307)

## 2023-11-19 ENCOUNTER — Inpatient Hospital Stay

## 2023-11-19 ENCOUNTER — Inpatient Hospital Stay (HOSPITAL_BASED_OUTPATIENT_CLINIC_OR_DEPARTMENT_OTHER): Admitting: Oncology

## 2023-11-19 ENCOUNTER — Encounter: Payer: Self-pay | Admitting: Oncology

## 2023-11-19 VITALS — BP 140/63 | HR 64 | Temp 96.6°F | Resp 18 | Wt 272.0 lb

## 2023-11-19 DIAGNOSIS — D631 Anemia in chronic kidney disease: Secondary | ICD-10-CM | POA: Diagnosis not present

## 2023-11-19 DIAGNOSIS — D508 Other iron deficiency anemias: Secondary | ICD-10-CM | POA: Diagnosis not present

## 2023-11-19 DIAGNOSIS — N1832 Chronic kidney disease, stage 3b: Secondary | ICD-10-CM | POA: Diagnosis not present

## 2023-11-19 DIAGNOSIS — D539 Nutritional anemia, unspecified: Secondary | ICD-10-CM | POA: Diagnosis not present

## 2023-11-19 NOTE — Assessment & Plan Note (Addendum)
 Labs are reviewed and discussed with patient. Lab Results  Component Value Date   HGB 11.7 (L) 11/15/2023   TIBC 372 11/15/2023   IRONPCTSAT 14 11/15/2023   FERRITIN 137 11/15/2023  Previously had IV Venofer  weekly x 4.  She tolerated well. Improved hemoglobin to 11.7.  Ferritin is stable.  I will hold off IV Venofer  treatments.   Recommend patient to continue oral Vitron-C once daily.

## 2023-11-19 NOTE — Progress Notes (Signed)
 Hematology/Oncology Progress note [phone] [phone]      Patient Care Team: Valora Lynwood FALCON, MD as PCP - General (Family Medicine) Babara Call, MD as Consulting Physician (Oncology)  REFERRING PROVIDER: Valora Lynwood FALCON, MD  CHIEF COMPLAINTS/REASON FOR VISIT:  Anemia   ASSESSMENT & PLAN:   Iron  deficiency anemia Labs are reviewed and discussed with patient. Lab Results  Component Value Date   HGB 11.7 (L) 11/15/2023   TIBC 372 11/15/2023   IRONPCTSAT 14 11/15/2023   FERRITIN 137 11/15/2023  Previously had IV Venofer  weekly x 4.  She tolerated well. Improved hemoglobin to 11.7.  Ferritin is stable.  I will hold off IV Venofer  treatments.   Recommend patient to continue oral Vitron-C once daily.   Anemia in chronic kidney disease (CKD) There maybe a component of anemia due to CKD.  Hemoglobin has improved.  Above 10.  Macrocytic anemia Likely due to methotrexate due.  B12 was elevated in May 2025.  Will check B12 and folate at next visit.   Orders Placed This Encounter  Procedures   CBC with Differential (Cancer Center Only)    Standing Status:   Future    Expected Date:   05/18/2024    Expiration Date:   08/16/2024   Iron  and TIBC    Standing Status:   Future    Expected Date:   05/18/2024    Expiration Date:   08/16/2024   Ferritin    Standing Status:   Future    Expected Date:   05/18/2024    Expiration Date:   08/16/2024   Retic Panel    Standing Status:   Future    Expected Date:   05/18/2024    Expiration Date:   08/16/2024   Sign language interpreter presents with entire encounter. Follow-up in 6 months. All questions were answered. The patient knows to call the clinic with any problems, questions or concerns.  Call Babara, MD, PhD [facility] Hematology Oncology 11/19/2023   HISTORY OF PRESENTING ILLNESS:   Alice Hughes is a  68 y.o.  female with PMH listed below was seen in consultation at the request of  Valora Lynwood FALCON, MD   for evaluation of anemia. She was seen by me once in 2021 and did not follow up   Patient has hearing impairment.  She was accompanied by her husband who also has hearing impairment. Online sign language interpreter service was utilized for the entire encounter.  She has history of chronic kidney disease-11/11/2019, Rheumatoid arthritis, on methotrexate.  She takes folic acid  daily.  Patient denies any unintentional weight loss, fever, night sweats.  She has good appetite.  She feels tired. Denies hematochezia, hematuria, hematemesis, epistaxis, black tarry stool. Not on anticoagulation.      INTERVAL HISTORY Alice Hughes is a 68 y.o. female who has above history reviewed by me today presents for follow up visit for anemia. She tolerated IV Venofer  treatments.  No significant side effects Fatigue has improved.  No new complaints.  July 2025, colonoscopy showed diverticulosis.  Internal hemorrhoids.  3 mm polyp at the appendiceal orifice removed-pathology showed tubular adenoma.  2 mm polyp in the transverse colon.  Removed and pathology showed tubular adenoma.  Bowel preparation was suboptimal.  GI recommended repeat colonoscopy 2 to 3 years  EGD showed normal esophagus.  Gastritis was biopsied.  Pathology showed chronic active gastritis with focal intestinal metaplasia.  Report is down significantly.    Review of Systems  Constitutional:  Positive  for fatigue. Negative for appetite change, chills and fever.  HENT:   Negative for hearing loss and voice change.   Eyes:  Negative for eye problems.  Respiratory:  Negative for chest tightness and cough.   Cardiovascular:  Positive for leg swelling. Negative for chest pain.  Gastrointestinal:  Negative for abdominal distention, abdominal pain and blood in stool.  Endocrine: Negative for hot flashes.  Genitourinary:  Negative for difficulty urinating and frequency.   Musculoskeletal:  Negative for arthralgias.  Skin:  Negative for itching  and rash.  Neurological:  Negative for extremity weakness.  Hematological:  Negative for adenopathy.  Psychiatric/Behavioral:  Negative for confusion.     MEDICAL HISTORY:  Past Medical History:  Diagnosis Date   Anemia in chronic kidney disease (CKD) 02/06/2023   Arthritis    Chronic venous insufficiency    CKD (chronic kidney disease), stage III (HCC)    Deaf    Diabetic retinopathy (HCC)    Diverticulitis    Gallstones    Generalized osteoarthritis    Hearing impaired person, bilateral    Heart failure with preserved ejection fraction (HCC)    Hiatal hernia    Hyperlipidemia    Hypertension    Idiopathic chronic gout of foot without tophus    Long term methotrexate user    Lymphedema    Myalgia 03/03/2018   Numbness and tingling in both hands    Obesity    Pulmonary hypertension (HCC)    RBBB (right bundle branch block)    Rheumatoid arthritis (HCC)    a.) Tx'd with long term MTX   Synovial cyst of right wrist    T2DM (type 2 diabetes mellitus) (HCC)    Transverse myelitis (HCC)    Ventral abdominal wall hernia     SURGICAL HISTORY: Past Surgical History:  Procedure Laterality Date   CARDIAC CATHETERIZATION     CHOLECYSTECTOMY     COLONOSCOPY N/A 05/07/2023   Procedure: COLONOSCOPY;  Surgeon: Maryruth Ole DASEN, MD;  Location: [facility] ENDOSCOPY;  Service: Endoscopy;  Laterality: N/A;  NEEDS SIGN INTERPRETER - TRISH REQUESTED ASL AT 8:45 AM, MAY 6TH   COLONOSCOPY N/A 07/16/2023   Procedure: COLONOSCOPY;  Surgeon: Maryruth Ole DASEN, MD;  Location: [facility] ENDOSCOPY;  Service: Endoscopy;  Laterality: N/A;  IDDM & TRULICITY,  SIGN LANGUAGE - 1ST CASE   ESOPHAGOGASTRODUODENOSCOPY N/A 05/07/2023   Procedure: EGD (ESOPHAGOGASTRODUODENOSCOPY);  Surgeon: Maryruth Ole DASEN, MD;  Location: [facility] ENDOSCOPY;  Service: Endoscopy;  Laterality: N/A;   ESOPHAGOGASTRODUODENOSCOPY N/A 07/16/2023   Procedure: EGD (ESOPHAGOGASTRODUODENOSCOPY);  Surgeon: Maryruth Ole DASEN, MD;   Location: [facility] ENDOSCOPY;  Service: Endoscopy;  Laterality: N/A;   EXCISION MASS UPPER EXTREMETIES Right 07/03/2023   Procedure: EXCISION MASS UPPER EXTREMITIES;  Surgeon: Edie Norleen PARAS, MD;  Location: [facility] ORS;  Service: Orthopedics;  Laterality: Right;   HYSTERECTOMY, VAGINAL, WITH SALPINGO-OOPHORECTOMY N/A    POLYPECTOMY  07/16/2023   Procedure: POLYPECTOMY, INTESTINE;  Surgeon: Maryruth Ole DASEN, MD;  Location: [facility] ENDOSCOPY;  Service: Endoscopy;;   RIGHT HEART CATH AND CORONARY ANGIOGRAPHY Right 05/19/2019   Procedure: RIGHT HEART CATH;  Surgeon: Bosie Vinie LABOR, MD;  Location: [facility] INVASIVE CV LAB;  Service: Cardiovascular;  Laterality: Right;    SOCIAL HISTORY: Social History   Socioeconomic History   Marital status: Married    Spouse name: Vinie   Number of children: Not on file   Years of education: Not on file   Highest education level: Not on file  Occupational History   Not  on file  Tobacco Use   Smoking status: Never    Passive exposure: Never   Smokeless tobacco: Never  Vaping Use   Vaping status: Never Used  Substance and Sexual Activity   Alcohol use: Not Currently   Drug use: Never   Sexual activity: Not on file  Other Topics Concern   Not on file  Social History Narrative   Not on file   Social Drivers of Health   Financial Resource Strain: Low Risk  (06/14/2023)   Received from [facility]   Overall Financial Resource Strain (CARDIA)    Difficulty of Paying Living Expenses: Not hard at all  Food Insecurity: No Food Insecurity (06/14/2023)   Received from [facility]   Hunger Vital Sign    Within the past 12 months, you worried that your food would run out before you got the money to buy more.: Never true    Within the past 12 months, the food you bought just didn't last and you didn't have money to get more.: Never true  Recent Concern: Food Insecurity - Food Insecurity Present (04/03/2023)   Received from [facility]   Hunger Vital Sign    Within the past 12 months, you worried that your food would run out before you got the money to buy more.: Sometimes true    Within the past 12 months, the food you bought just didn't last and you didn't have money to get more.: Sometimes true  Transportation Needs: No Transportation Needs (06/14/2023)   Received from [facility] - Transportation    In the past 12 months, has lack of transportation kept you from medical appointments or from getting medications?: No    Lack of Transportation (Non-Medical): No  Physical Activity: Not on file  Stress: Not on file  Social Connections: Not on file  Intimate Partner Violence: Not At Risk (02/06/2023)   Humiliation, Afraid, Rape, and Kick questionnaire    Fear of Current or Ex-Partner: No    Emotionally Abused: No    Physically Abused: No    Sexually Abused: No    FAMILY HISTORY: Family History  Problem Relation Age of Onset   Diabetes Mother    Diabetes Father    Heart disease Father    Breast cancer Sister 55   Diabetes Brother     ALLERGIES:  is allergic to iodinated contrast media.  MEDICATIONS:  Current Outpatient Medications  Medication Sig Dispense Refill   acetaminophen  (TYLENOL ) 500 MG tablet Take 500-1,000 mg by mouth every 6 (six) hours as needed (pain.).     allopurinol (ZYLOPRIM) 300 MG tablet Take 300 mg by mouth in the morning.     colchicine 0.6 MG tablet Take 0.6 mg by mouth in the morning.     cyanocobalamin  (VITAMIN B12) 1000 MCG tablet Take 1,000 mcg by mouth daily at 12 noon.     Dulaglutide (TRULICITY) 0.75 MG/0.5ML SOAJ Inject 0.75 mg into the skin every Saturday.     folic acid  (FOLVITE ) 1 MG tablet Take 1 mg by mouth in the morning.     furosemide (LASIX) 20 MG tablet Take 20 mg by mouth in the morning.     gabapentin (NEURONTIN) 300 MG capsule Take 300 mg by mouth 2 (two) times daily.     HYDROcodone -acetaminophen  (NORCO/VICODIN)  5-325 MG tablet Take 1 tablet by mouth every 4 (four) hours as needed for moderate pain (pain score 4-6) or  severe pain (pain score 7-10). 20 tablet 0   insulin  lispro (HUMALOG) 100 UNIT/ML KwikPen Inject 50-60 Units into the skin See admin instructions. Inject 50 units subcutaneously in the morning & inject 60 units subcutaneously at bedtime.     Iron -Vitamin C  65-125 MG TABS Take 1 tablet by mouth daily.     JARDIANCE 10 MG TABS tablet Take 5 mg by mouth in the morning.     LANTUS SOLOSTAR 100 UNIT/ML Solostar Pen Inject 80 Units into the skin at bedtime.     lisinopril (ZESTRIL) 10 MG tablet Take 10 mg by mouth in the morning.     methotrexate (RHEUMATREX) 2.5 MG tablet Take 7.5 mg by mouth every Friday.     rosuvastatin (CRESTOR) 5 MG tablet Take 5 mg by mouth in the morning.     No current facility-administered medications for this visit.   Facility-Administered Medications Ordered in Other Visits  Medication Dose Route Frequency Provider Last Rate Last Admin   sodium chloride  flush (NS) 0.9 % injection 3 mL  3 mL Intravenous Q12H Bosie Vinie LABOR, MD         PHYSICAL EXAMINATION: ECOG PERFORMANCE STATUS: 2 - Symptomatic, <50% confined to bed Vitals:   11/19/23 1457  BP: (!) 140/63  Pulse: 64  Resp: 18  Temp: (!) 96.6 F (35.9 C)  SpO2: 99%   Filed Weights   11/19/23 1457  Weight: 272 lb (123.4 kg)     Physical Exam Constitutional:      General: She is not in acute distress.    Appearance: She is obese.  HENT:     Head: Normocephalic and atraumatic.  Eyes:     General: No scleral icterus. Cardiovascular:     Rate and Rhythm: Normal rate and regular rhythm.  Pulmonary:     Effort: Pulmonary effort is normal. No respiratory distress.     Breath sounds: No wheezing.  Abdominal:     General: There is no distension.  Musculoskeletal:        General: No deformity. Normal range of motion.     Cervical back: Normal range of motion and neck supple.  Skin:    General:  Skin is warm and dry.     Findings: No erythema or rash.  Neurological:     Mental Status: She is alert. Mental status is at baseline.     Comments: Chronic hearing impairment  Psychiatric:        Mood and Affect: Mood normal.     LABORATORY DATA:  I have reviewed the data as listed Lab Results  Component Value Date   WBC 8.4 11/15/2023   HGB 11.7 (L) 11/15/2023   HCT 36.1 11/15/2023   MCV 106.2 (H) 11/15/2023   PLT 191 11/15/2023   Recent Labs    05/07/23 1029 09/07/23 0015  NA 138 130*  K 4.4 3.7  CL 105 94*  CO2 21* 22  GLUCOSE 457* 296*  BUN 32* 52*  CREATININE 1.60* 2.10*  CALCIUM 9.3 9.2  GFRNONAA 35* 25*   Iron /TIBC/Ferritin/ %Sat    Component Value Date/Time   IRON  51 11/15/2023 1447   TIBC 372 11/15/2023 1447   FERRITIN 137 11/15/2023 1447   IRONPCTSAT 14 11/15/2023 1447      RADIOGRAPHIC STUDIES: I have personally reviewed the radiological images as listed and agreed with the findings in the report. No results found.

## 2023-11-19 NOTE — Assessment & Plan Note (Addendum)
 Likely due to methotrexate due.  B12 was elevated in May 2025.  Will check B12 and folate at next visit.

## 2023-11-19 NOTE — Assessment & Plan Note (Signed)
 There maybe a component of anemia due to CKD.  Hemoglobin has improved.  Above 10.

## 2024-05-22 ENCOUNTER — Inpatient Hospital Stay

## 2024-05-26 ENCOUNTER — Inpatient Hospital Stay

## 2024-05-26 ENCOUNTER — Inpatient Hospital Stay: Admitting: Oncology
